# Patient Record
Sex: Female | Born: 1985 | Race: Black or African American | Hispanic: No | Marital: Single | Smoking: Never smoker
Health system: Southern US, Community
[De-identification: ages and names within clinical notes are randomized; demographics above are authoritative.]

## PROBLEM LIST (undated history)

## (undated) ENCOUNTER — Inpatient Hospital Stay (HOSPITAL_COMMUNITY): Payer: Self-pay

## (undated) DIAGNOSIS — A0472 Enterocolitis due to Clostridium difficile, not specified as recurrent: Secondary | ICD-10-CM

## (undated) DIAGNOSIS — R87629 Unspecified abnormal cytological findings in specimens from vagina: Secondary | ICD-10-CM

## (undated) DIAGNOSIS — A749 Chlamydial infection, unspecified: Secondary | ICD-10-CM

## (undated) DIAGNOSIS — D219 Benign neoplasm of connective and other soft tissue, unspecified: Secondary | ICD-10-CM

## (undated) DIAGNOSIS — Z87448 Personal history of other diseases of urinary system: Secondary | ICD-10-CM

## (undated) DIAGNOSIS — A498 Other bacterial infections of unspecified site: Secondary | ICD-10-CM

## (undated) DIAGNOSIS — N390 Urinary tract infection, site not specified: Secondary | ICD-10-CM

## (undated) HISTORY — PX: DILATION AND CURETTAGE OF UTERUS: SHX78

## (undated) HISTORY — DX: Personal history of other diseases of urinary system: Z87.448

## (undated) HISTORY — PX: CYSTECTOMY: SUR359

---

## 2008-06-04 ENCOUNTER — Emergency Department (HOSPITAL_COMMUNITY): Admission: EM | Admit: 2008-06-04 | Discharge: 2008-06-04 | Payer: Self-pay | Admitting: Family Medicine

## 2008-09-27 ENCOUNTER — Emergency Department (HOSPITAL_COMMUNITY): Admission: EM | Admit: 2008-09-27 | Discharge: 2008-09-27 | Payer: Self-pay | Admitting: Emergency Medicine

## 2008-09-29 ENCOUNTER — Emergency Department (HOSPITAL_COMMUNITY): Admission: EM | Admit: 2008-09-29 | Discharge: 2008-09-29 | Payer: Self-pay | Admitting: Family Medicine

## 2008-11-04 ENCOUNTER — Emergency Department (HOSPITAL_COMMUNITY): Admission: EM | Admit: 2008-11-04 | Discharge: 2008-11-04 | Payer: Self-pay | Admitting: Family Medicine

## 2008-11-04 ENCOUNTER — Inpatient Hospital Stay (HOSPITAL_COMMUNITY): Admission: AD | Admit: 2008-11-04 | Discharge: 2008-11-05 | Payer: Self-pay | Admitting: Obstetrics & Gynecology

## 2009-02-13 DIAGNOSIS — A0472 Enterocolitis due to Clostridium difficile, not specified as recurrent: Secondary | ICD-10-CM

## 2009-02-13 DIAGNOSIS — A498 Other bacterial infections of unspecified site: Secondary | ICD-10-CM

## 2009-02-13 HISTORY — DX: Enterocolitis due to Clostridium difficile, not specified as recurrent: A04.72

## 2009-02-13 HISTORY — DX: Other bacterial infections of unspecified site: A49.8

## 2009-04-06 ENCOUNTER — Emergency Department (HOSPITAL_COMMUNITY): Admission: EM | Admit: 2009-04-06 | Discharge: 2009-04-06 | Payer: Self-pay | Admitting: Emergency Medicine

## 2009-06-16 ENCOUNTER — Emergency Department (HOSPITAL_COMMUNITY): Admission: EM | Admit: 2009-06-16 | Discharge: 2009-06-16 | Payer: Self-pay | Admitting: Family Medicine

## 2009-06-24 ENCOUNTER — Emergency Department (HOSPITAL_COMMUNITY): Admission: EM | Admit: 2009-06-24 | Discharge: 2009-06-24 | Payer: Self-pay | Admitting: Emergency Medicine

## 2009-08-21 ENCOUNTER — Emergency Department (HOSPITAL_COMMUNITY): Admission: EM | Admit: 2009-08-21 | Discharge: 2009-08-21 | Payer: Self-pay | Admitting: Emergency Medicine

## 2009-08-23 ENCOUNTER — Observation Stay (HOSPITAL_COMMUNITY): Admission: EM | Admit: 2009-08-23 | Discharge: 2009-08-23 | Payer: Self-pay | Admitting: Emergency Medicine

## 2009-08-24 ENCOUNTER — Encounter (INDEPENDENT_AMBULATORY_CARE_PROVIDER_SITE_OTHER): Payer: Self-pay | Admitting: *Deleted

## 2009-09-18 ENCOUNTER — Inpatient Hospital Stay (HOSPITAL_COMMUNITY): Admission: AD | Admit: 2009-09-18 | Discharge: 2009-09-18 | Payer: Self-pay | Admitting: Obstetrics & Gynecology

## 2009-09-18 ENCOUNTER — Ambulatory Visit: Payer: Self-pay | Admitting: Nurse Practitioner

## 2009-10-01 ENCOUNTER — Ambulatory Visit: Payer: Self-pay | Admitting: Gastroenterology

## 2009-10-08 ENCOUNTER — Emergency Department (HOSPITAL_COMMUNITY): Admission: EM | Admit: 2009-10-08 | Discharge: 2009-10-08 | Payer: Self-pay | Admitting: Family Medicine

## 2010-01-09 ENCOUNTER — Emergency Department (HOSPITAL_COMMUNITY): Admission: EM | Admit: 2010-01-09 | Discharge: 2010-01-09 | Payer: Self-pay | Admitting: Emergency Medicine

## 2010-03-07 ENCOUNTER — Inpatient Hospital Stay (HOSPITAL_COMMUNITY)
Admission: AD | Admit: 2010-03-07 | Discharge: 2010-03-07 | Payer: Self-pay | Source: Home / Self Care | Attending: Obstetrics & Gynecology | Admitting: Obstetrics & Gynecology

## 2010-03-08 LAB — CBC
HCT: 36.9 % (ref 36.0–46.0)
Hemoglobin: 12 g/dL (ref 12.0–15.0)
MCH: 25.6 pg — ABNORMAL LOW (ref 26.0–34.0)
RBC: 4.68 MIL/uL (ref 3.87–5.11)
RDW: 13.6 % (ref 11.5–15.5)

## 2010-03-08 LAB — URINALYSIS, ROUTINE W REFLEX MICROSCOPIC
Bilirubin Urine: NEGATIVE
Nitrite: NEGATIVE
Protein, ur: NEGATIVE mg/dL
Specific Gravity, Urine: 1.02 (ref 1.005–1.030)
Urobilinogen, UA: 0.2 mg/dL (ref 0.0–1.0)

## 2010-03-08 LAB — ABO/RH: ABO/RH(D): A POS

## 2010-03-08 LAB — WET PREP, GENITAL: Trich, Wet Prep: NONE SEEN

## 2010-03-09 ENCOUNTER — Inpatient Hospital Stay (HOSPITAL_COMMUNITY)
Admission: AD | Admit: 2010-03-09 | Discharge: 2010-03-09 | Payer: Self-pay | Source: Home / Self Care | Attending: Obstetrics & Gynecology | Admitting: Obstetrics & Gynecology

## 2010-03-09 LAB — GC/CHLAMYDIA PROBE AMP, GENITAL: GC Probe Amp, Genital: NEGATIVE

## 2010-03-15 ENCOUNTER — Ambulatory Visit (HOSPITAL_COMMUNITY)
Admission: RE | Admit: 2010-03-15 | Discharge: 2010-03-15 | Payer: Self-pay | Source: Home / Self Care | Attending: Family Medicine | Admitting: Family Medicine

## 2010-03-15 DIAGNOSIS — O0289 Other abnormal products of conception: Secondary | ICD-10-CM

## 2010-03-15 NOTE — Letter (Signed)
Summary: New Patient letter  [facility]  [address] [place], [place] [address]   Phone: [phone]  Fax: [phone]       08/24/2009 MRN: [id number]  Kendra Guerrero [address] [place], [place]  [address]  Dear Kendra Guerrero,  Welcome to the Gastroenterology Division at [facility].    You are scheduled to see Dr.  Jarold Motto on 10-01-09 at [facility] on the 3rd floor at [facility], [address].  We ask that you try to arrive at our office 15 minutes prior to your appointment time to allow for check-in.  We would like you to complete the enclosed self-administered evaluation form prior to your visit and bring it with you on the day of your appointment.  We will review it with you.  Also, please bring a complete list of all your medications or, if you prefer, bring the medication bottles and we will list them.  Please bring your insurance card so that we may make a copy of it.  If your insurance requires a referral to see a specialist, please bring your referral form from your primary care physician.  Co-payments are due at the time of your visit and may be paid by cash, check or credit card.     Your office visit will consist of a consult with your physician (includes a physical exam), any laboratory testing he/she may order, scheduling of any necessary diagnostic testing (e.g. x-ray, ultrasound, CT-scan), and scheduling of a procedure (e.g. Endoscopy, Colonoscopy) if required.  Please allow enough time on your schedule to allow for any/all of these possibilities.    If you cannot keep your appointment, please call [phone] to cancel or reschedule prior to your appointment date.  This allows [place] the opportunity to schedule an appointment for another patient in need of care.  If you do not cancel or reschedule by 5 p.m. the business day prior to your appointment date, you will be charged a $50.00 late cancellation/no-show fee.    Thank you for  choosing [facility] for your medical needs.  We appreciate the opportunity to care for you.  Please visit [place] at our website  to learn more about our practice.                     Sincerely,                                                             The Gastroenterology Division

## 2010-03-23 ENCOUNTER — Emergency Department (HOSPITAL_COMMUNITY): Payer: Medicaid Other

## 2010-03-23 ENCOUNTER — Emergency Department (HOSPITAL_COMMUNITY)
Admission: EM | Admit: 2010-03-23 | Discharge: 2010-03-23 | Discharge status: Against Medical Advice | Payer: Medicaid Other | Attending: Emergency Medicine | Admitting: Emergency Medicine

## 2010-03-23 DIAGNOSIS — N898 Other specified noninflammatory disorders of vagina: Secondary | ICD-10-CM | POA: Insufficient documentation

## 2010-03-23 DIAGNOSIS — R109 Unspecified abdominal pain: Secondary | ICD-10-CM | POA: Insufficient documentation

## 2010-03-23 LAB — BASIC METABOLIC PANEL
BUN: 10 mg/dL (ref 6–23)
CO2: 27 mEq/L (ref 19–32)
Calcium: 9 mg/dL (ref 8.4–10.5)
Chloride: 105 mEq/L (ref 96–112)
Glucose, Bld: 112 mg/dL — ABNORMAL HIGH (ref 70–99)

## 2010-03-23 LAB — DIFFERENTIAL
Eosinophils Absolute: 0.2 10*3/uL (ref 0.0–0.7)
Lymphs Abs: 3.1 10*3/uL (ref 0.7–4.0)
Monocytes Absolute: 0.5 10*3/uL (ref 0.1–1.0)
Monocytes Relative: 7 % (ref 3–12)
Neutro Abs: 3.3 10*3/uL (ref 1.7–7.7)

## 2010-03-23 LAB — CBC
MCH: 25.3 pg — ABNORMAL LOW (ref 26.0–34.0)
MCHC: 32.8 g/dL (ref 30.0–36.0)
MCV: 77.3 fL — ABNORMAL LOW (ref 78.0–100.0)
Platelets: 301 10*3/uL (ref 150–400)
WBC: 7.2 10*3/uL (ref 4.0–10.5)

## 2010-04-26 LAB — URINALYSIS, ROUTINE W REFLEX MICROSCOPIC
Bilirubin Urine: NEGATIVE
Glucose, UA: NEGATIVE mg/dL
Ketones, ur: NEGATIVE mg/dL
Nitrite: NEGATIVE
Protein, ur: 30 mg/dL — AB
Specific Gravity, Urine: 1.025 (ref 1.005–1.030)
Urobilinogen, UA: 0.2 mg/dL (ref 0.0–1.0)
pH: 7 (ref 5.0–8.0)

## 2010-04-26 LAB — URINE MICROSCOPIC-ADD ON

## 2010-04-26 LAB — POCT PREGNANCY, URINE: Preg Test, Ur: NEGATIVE

## 2010-04-29 LAB — HCG, QUANTITATIVE, PREGNANCY: hCG, Beta Chain, Quant, S: 2379 m[IU]/mL — ABNORMAL HIGH (ref <5)

## 2010-04-29 LAB — CBC
MCH: 26.5 pg (ref 26.0–34.0)
MCHC: 33.3 g/dL (ref 30.0–36.0)
MCV: 79.6 fL (ref 78.0–100.0)
Platelets: 250 10*3/uL (ref 150–400)
RDW: 14.6 % (ref 11.5–15.5)

## 2010-04-29 LAB — URINALYSIS, ROUTINE W REFLEX MICROSCOPIC
Protein, ur: NEGATIVE mg/dL
Specific Gravity, Urine: 1.025 (ref 1.005–1.030)

## 2010-04-29 LAB — WET PREP, GENITAL: Clue Cells Wet Prep HPF POC: NONE SEEN

## 2010-05-01 LAB — CBC
HCT: 35.3 % — ABNORMAL LOW (ref 36.0–46.0)
Hemoglobin: 11.8 g/dL — ABNORMAL LOW (ref 12.0–15.0)
MCH: 26.7 pg (ref 26.0–34.0)
MCV: 80 fL (ref 78.0–100.0)
MCV: 80.2 fL (ref 78.0–100.0)
Platelets: 220 10*3/uL (ref 150–400)
Platelets: 270 10*3/uL (ref 150–400)
RBC: 4.41 MIL/uL (ref 3.87–5.11)
RBC: 4.67 MIL/uL (ref 3.87–5.11)
RDW: 14 % (ref 11.5–15.5)
WBC: 15 10*3/uL — ABNORMAL HIGH (ref 4.0–10.5)
WBC: 9.9 10*3/uL (ref 4.0–10.5)

## 2010-05-01 LAB — URINALYSIS, ROUTINE W REFLEX MICROSCOPIC
Glucose, UA: NEGATIVE mg/dL
Nitrite: NEGATIVE
Specific Gravity, Urine: 1.018 (ref 1.005–1.030)
pH: 6 (ref 5.0–8.0)

## 2010-05-01 LAB — COMPREHENSIVE METABOLIC PANEL
Albumin: 3.1 g/dL — ABNORMAL LOW (ref 3.5–5.2)
Alkaline Phosphatase: 60 U/L (ref 39–117)
BUN: 5 mg/dL — ABNORMAL LOW (ref 6–23)
CO2: 26 mEq/L (ref 19–32)
Chloride: 106 mEq/L (ref 96–112)
GFR calc non Af Amer: 59 mL/min — ABNORMAL LOW (ref >60)
Potassium: 3.2 mEq/L — ABNORMAL LOW (ref 3.5–5.1)
Total Bilirubin: 0.6 mg/dL (ref 0.3–1.2)

## 2010-05-01 LAB — URINE MICROSCOPIC-ADD ON

## 2010-05-01 LAB — GIARDIA/CRYPTOSPORIDIUM SCREEN(EIA): Cryptosporidium Screen (EIA): NEGATIVE

## 2010-05-01 LAB — DIFFERENTIAL
Basophils Absolute: 0 10*3/uL (ref 0.0–0.1)
Basophils Absolute: 0 10*3/uL (ref 0.0–0.1)
Basophils Relative: 0 % (ref 0–1)
Eosinophils Relative: 0 % (ref 0–5)
Eosinophils Relative: 1 % (ref 0–5)
Lymphocytes Relative: 5 % — ABNORMAL LOW (ref 12–46)
Lymphs Abs: 0.8 10*3/uL (ref 0.7–4.0)
Monocytes Absolute: 1 10*3/uL (ref 0.1–1.0)
Neutro Abs: 13.8 10*3/uL — ABNORMAL HIGH (ref 1.7–7.7)
Neutro Abs: 7.6 10*3/uL (ref 1.7–7.7)

## 2010-05-01 LAB — CLOSTRIDIUM DIFFICILE EIA: C difficile Toxins A+B, EIA: NEGATIVE

## 2010-05-01 LAB — PREGNANCY, URINE: Preg Test, Ur: NEGATIVE

## 2010-05-01 LAB — BASIC METABOLIC PANEL
BUN: 9 mg/dL (ref 6–23)
Chloride: 105 mEq/L (ref 96–112)
Creatinine, Ser: 1.09 mg/dL (ref 0.4–1.2)
GFR calc Af Amer: >60 mL/min (ref >60)
GFR calc non Af Amer: >60 mL/min (ref >60)
Potassium: 3.1 mEq/L — ABNORMAL LOW (ref 3.5–5.1)

## 2010-05-01 LAB — LIPASE, BLOOD: Lipase: 17 U/L (ref 11–59)

## 2010-05-03 LAB — STOOL CULTURE

## 2010-05-03 LAB — POCT I-STAT, CHEM 8
Calcium, Ion: 1.16 mmol/L (ref 1.12–1.32)
Glucose, Bld: 98 mg/dL (ref 70–99)
HCT: 42 % (ref 36.0–46.0)
Hemoglobin: 14.3 g/dL (ref 12.0–15.0)
TCO2: 28 mmol/L (ref 0–100)

## 2010-05-03 LAB — URINALYSIS, ROUTINE W REFLEX MICROSCOPIC
Ketones, ur: 40 mg/dL — AB
Nitrite: NEGATIVE
Protein, ur: NEGATIVE mg/dL
Urobilinogen, UA: 0.2 mg/dL (ref 0.0–1.0)

## 2010-05-03 LAB — POCT URINALYSIS DIP (DEVICE)
Bilirubin Urine: NEGATIVE
Nitrite: NEGATIVE
Protein, ur: NEGATIVE mg/dL
pH: 5.5 (ref 5.0–8.0)

## 2010-05-03 LAB — CLOSTRIDIUM DIFFICILE EIA

## 2010-05-04 LAB — WET PREP, GENITAL: Clue Cells Wet Prep HPF POC: NONE SEEN

## 2010-05-04 LAB — GC/CHLAMYDIA PROBE AMP, GENITAL: Chlamydia, DNA Probe: NEGATIVE

## 2010-05-10 ENCOUNTER — Inpatient Hospital Stay (INDEPENDENT_AMBULATORY_CARE_PROVIDER_SITE_OTHER)
Admission: RE | Admit: 2010-05-10 | Discharge: 2010-05-10 | Disposition: A | Discharge status: Home / Self Care | Payer: Self-pay | Source: Ambulatory Visit | Attending: Family Medicine | Admitting: Family Medicine

## 2010-05-10 DIAGNOSIS — R300 Dysuria: Secondary | ICD-10-CM

## 2010-05-10 LAB — POCT PREGNANCY, URINE: Preg Test, Ur: NEGATIVE

## 2010-05-11 LAB — POCT URINALYSIS DIP (DEVICE)
Nitrite: NEGATIVE
Protein, ur: 100 mg/dL — AB
Urobilinogen, UA: 0.2 mg/dL (ref 0.0–1.0)

## 2010-05-20 LAB — WET PREP, GENITAL
Trich, Wet Prep: NONE SEEN
Yeast Wet Prep HPF POC: NONE SEEN

## 2010-05-20 LAB — POCT URINALYSIS DIP (DEVICE)
Glucose, UA: NEGATIVE mg/dL
Nitrite: NEGATIVE
Urobilinogen, UA: 2 mg/dL — ABNORMAL HIGH (ref 0.0–1.0)

## 2010-05-20 LAB — POCT PREGNANCY, URINE: Preg Test, Ur: POSITIVE

## 2010-05-20 LAB — CBC
MCV: 78.6 fL (ref 78.0–100.0)
Platelets: 278 10*3/uL (ref 150–400)
WBC: 7.6 10*3/uL (ref 4.0–10.5)

## 2010-05-20 LAB — GC/CHLAMYDIA PROBE AMP, GENITAL: GC Probe Amp, Genital: NEGATIVE

## 2010-05-21 LAB — POCT RAPID STREP A (OFFICE): Streptococcus, Group A Screen (Direct): NEGATIVE

## 2010-05-25 ENCOUNTER — Inpatient Hospital Stay (INDEPENDENT_AMBULATORY_CARE_PROVIDER_SITE_OTHER)
Admission: RE | Admit: 2010-05-25 | Discharge: 2010-05-25 | Disposition: A | Discharge status: Home / Self Care | Payer: Self-pay | Source: Ambulatory Visit

## 2010-05-25 DIAGNOSIS — N760 Acute vaginitis: Secondary | ICD-10-CM

## 2010-05-25 DIAGNOSIS — A499 Bacterial infection, unspecified: Secondary | ICD-10-CM

## 2010-05-25 LAB — POCT URINALYSIS DIP (DEVICE)
Bilirubin Urine: NEGATIVE
Ketones, ur: NEGATIVE mg/dL
pH: 6 (ref 5.0–8.0)

## 2011-04-02 ENCOUNTER — Inpatient Hospital Stay (HOSPITAL_COMMUNITY): Payer: Self-pay

## 2011-04-02 ENCOUNTER — Encounter (HOSPITAL_COMMUNITY): Payer: Self-pay | Admitting: *Deleted

## 2011-04-02 ENCOUNTER — Inpatient Hospital Stay (HOSPITAL_COMMUNITY)
Admission: AD | Admit: 2011-04-02 | Discharge: 2011-04-02 | Disposition: A | Discharge status: Home / Self Care | Payer: Self-pay | Source: Ambulatory Visit | Attending: Obstetrics & Gynecology | Admitting: Obstetrics & Gynecology

## 2011-04-02 DIAGNOSIS — O046 Delayed or excessive hemorrhage following (induced) termination of pregnancy: Secondary | ICD-10-CM | POA: Insufficient documentation

## 2011-04-02 HISTORY — DX: Chlamydial infection, unspecified: A74.9

## 2011-04-02 HISTORY — DX: Enterocolitis due to Clostridium difficile, not specified as recurrent: A04.72

## 2011-04-02 LAB — CBC
Hemoglobin: 12.3 g/dL (ref 12.0–15.0)
MCH: 25.9 pg — ABNORMAL LOW (ref 26.0–34.0)
RBC: 4.74 MIL/uL (ref 3.87–5.11)

## 2011-04-02 LAB — HCG, QUANTITATIVE, PREGNANCY: hCG, Beta Chain, Quant, S: 40152 m[IU]/mL — ABNORMAL HIGH (ref <5)

## 2011-04-02 MED ORDER — LACTATED RINGERS IV SOLN: INTRAVENOUS | Status: DC

## 2011-04-02 MED ORDER — ONDANSETRON HCL 4 MG/2ML IJ SOLN
4.0000 mg | Freq: Once | INTRAMUSCULAR | Status: AC
  Administered 2011-04-02: 4 mg via INTRAVENOUS
  Filled 2011-04-02: qty 2

## 2011-04-02 MED ORDER — HYDROMORPHONE HCL PF 1 MG/ML IJ SOLN
1.0000 mg | Freq: Once | INTRAMUSCULAR | Status: AC
  Administered 2011-04-02: 1 mg via INTRAVENOUS
  Filled 2011-04-02: qty 1

## 2011-04-02 MED ORDER — LACTATED RINGERS IV BOLUS (SEPSIS)
1000.0000 mL | Freq: Once | INTRAVENOUS | Status: AC
  Administered 2011-04-02: 1000 mL via INTRAVENOUS

## 2011-04-02 NOTE — Progress Notes (Signed)
V.Smith,CNM at bedside Evaluating bleeding.

## 2011-04-02 NOTE — Progress Notes (Signed)
Small to moderate amount of vaginal blood noted. POC removed from vaginal vault. Pt tolerated well.

## 2011-04-02 NOTE — Progress Notes (Signed)
Pt went to [facility] planning clinic today and got "the abortion pill". She took the medication at 1200 and started cramping and passing large clots around 1430.

## 2011-04-02 NOTE — ED Provider Notes (Signed)
History   Kendra Guerrero is a 26 y.o. year old G72P1011 female at [redacted]w[redacted]d weeks gestation [facility] [place] [facility] who arrived in MAU by EMS reporting severe abdominal pain and heavy vaginal bleeding that started at 1230 after taking 4 tablets of Cyt[facility] by [facility] at 1200. She denies passage of tissue. Pt states she was told that she had a single IUP.  CSN: [id number]  Arrival date & time 04/02/11  1650   None    Chief Complaint  Patient presents with  . Abdominal Pain    (Consider location/radiation/quality/duration/timing/severity/associated sxs/prior treatment) HPI  Past Medical History  Diagnosis Date  . Chlamydia   . C. difficile diarrhea 2011    Past Surgical History  Procedure Date  . No past surgeries     No family history on file.  History  Substance Use Topics  . Smoking status: Never Smoker   . Smokeless tobacco: Not on file  . Alcohol Use: No    OB History    Grav Para Term Preterm Abortions TAB SAB Ect Mult Living   3 1 1  1  1   1      Review of Systems  Constitutional: Positive for chills. Negative for fever.  Gastrointestinal: Positive for nausea and abdominal pain. Negative for vomiting, diarrhea and constipation.  Genitourinary: Positive for vaginal bleeding. Negative for dysuria and flank pain.  Neurological: Negative for syncope and light-headedness.   Allergies  Review of patient's allergies indicates no known allergies.  Home Medications  No current outpatient prescriptions on file.  BP 115/43  Pulse 95  Temp(Src) 98.7 F (37.1 C) (Oral)  Resp 18  LMP 02/14/2011  Physical Exam  Nursing note and vitals reviewed. Constitutional: She is oriented to person, place, and time. She appears well-developed and well-nourished. She appears distressed (moderate).  Cardiovascular: Normal rate.   Pulmonary/Chest: Effort normal.  Abdominal: Soft. She exhibits no distension. There is tenderness. There is no guarding.   Genitourinary: There is no lesion on the right labia. There is no lesion on the left labia. Uterus is tender. Right adnexum displays no mass, no tenderness and no fullness. Left adnexum displays no mass, no tenderness and no fullness. There is bleeding (moderate BRB w/ small clots. 2x4 cm tissue in vault. ) around the vagina.       Pt very uncomfortable on exam. Unable to localize pain. Moaning before and during exam.  Neurological: She is alert and oriented to person, place, and time.  Skin: Skin is warm and dry. No pallor.    ED Course  Procedures (including critical care time)  Results for orders placed during the hospital encounter of 04/02/11 (from the past 24 hour(s))  CBC     Status: Abnormal   Collection Time   04/02/11  5:10 PM      Component Value Range   WBC 11.0 (*) 4.0 - 10.5 (K/uL)   RBC 4.74  3.87 - 5.11 (MIL/uL)   Hemoglobin 12.3  12.0 - 15.0 (g/dL)   HCT 40.9  81.1 - 91.4 (%)   MCV 80.0  78.0 - 100.0 (fL)   MCH 25.9 (*) 26.0 - 34.0 (pg)   MCHC 32.5  30.0 - 36.0 (g/dL)   RDW 78.2  95.6 - 21.3 (%)   Platelets 294  150 - 400 (K/uL)  HCG, QUANTITATIVE, PREGNANCY     Status: Abnormal   Collection Time   04/02/11  5:11 PM  Component Value Range   hCG, Beta Chain, Quant, [place] [address] (*) <5 (mIU/mL)     US Ob Comp Less 14 Wks  04/02/2011  *RADIOLOGY REPORT*  Clinical Data: Heavy bleeding.  Pain after taking abortion medicine.  OBSTETRIC <14 WK [place] AND TRANSVAGINAL OB US  Technique:  Both transabdominal and transvaginal ultrasound examinations were performed for complete evaluation of the gestation as well as the maternal uterus, adnexal regions, and pelvic cul-de-sac.  Transvaginal technique was performed to assess early pregnancy.  Comparison:  None.  Intrauterine gestational sac:  No intrauterine gestational sac. Yolk sac: None visualized Embryo: None visualized Cardiac Activity: Not applicable Heart Rate: Not applicable bpm  Maternal uterus/adnexae: Endometrial thickness is  15 mm.  Retained products of conception is not excluded although there is no internal vascular flow in the thickened endometrium.  The left ovary demonstrates a corpus luteum cyst.  Both ovaries have a physiologic appearance.  Trace free fluid is present in the anatomic pelvis, likely physiologic.  IMPRESSION:  1.  No viable intrauterine pregnancy identified, presumably missed abortion or induced abortion. 2.  Endometrial thickening measuring 15 mm.  No internal vascular flow.  Retained products of conception are not excluded and follow up should be considered.  Original Report Authenticated By: Andreas Newport, M.D.   US Ob Transvaginal  04/02/2011  *RADIOLOGY REPORT*  Clinical Data: Heavy bleeding.  Pain after taking abortion medicine.  OBSTETRIC <14 WK [place] AND TRANSVAGINAL OB US  Technique:  Both transabdominal and transvaginal ultrasound examinations were performed for complete evaluation of the gestation as well as the maternal uterus, adnexal regions, and pelvic cul-de-sac.  Transvaginal technique was performed to assess early pregnancy.  Comparison:  None.  Intrauterine gestational sac:  No intrauterine gestational sac. Yolk sac: None visualized Embryo: None visualized Cardiac Activity: Not applicable Heart Rate: Not applicable bpm  Maternal uterus/adnexae: Endometrial thickness is 15 mm.  Retained products of conception is not excluded although there is no internal vascular flow in the thickened endometrium.  The left ovary demonstrates a corpus luteum cyst.  Both ovaries have a physiologic appearance.  Trace free fluid is present in the anatomic pelvis, likely physiologic.  IMPRESSION:  1.  No viable intrauterine pregnancy identified, presumably missed abortion or induced abortion. 2.  Endometrial thickening measuring 15 mm.  No internal vascular flow.  Retained products of conception are not excluded and follow up should be considered.  Original Report Authenticated By: Andreas Newport, M.D.   Pain 2/10  after Dilaudid. Nausea resolved w/ Zofran. NAD. Passed 6x8 cm irreg fragment of tan tissue, large amount for [redacted] week gestation. Bleeding small amount since pelvic exam.   1. Legal abortion with hemorrhage-complete, hemodynamically stable   MDM  D/C home per consult w/ Dr. Marice Potter Bleeding precautions F/U as scheduled at [facility] in 1 week or MAU PRN for worsening Sx. Iron supplement Start OCPs as directed Ibuprofen PRN for pain POC to path  Dorathy Kinsman 04/02/2011 5:46 PM

## 2011-05-19 ENCOUNTER — Encounter (HOSPITAL_COMMUNITY): Payer: Self-pay | Admitting: Emergency Medicine

## 2011-05-19 ENCOUNTER — Emergency Department (HOSPITAL_COMMUNITY)
Admission: EM | Admit: 2011-05-19 | Discharge: 2011-05-20 | Disposition: A | Discharge status: Home / Self Care | Payer: 59 | Attending: Emergency Medicine | Admitting: Emergency Medicine

## 2011-05-19 DIAGNOSIS — N390 Urinary tract infection, site not specified: Secondary | ICD-10-CM

## 2011-05-19 DIAGNOSIS — R300 Dysuria: Secondary | ICD-10-CM | POA: Insufficient documentation

## 2011-05-19 LAB — URINALYSIS, ROUTINE W REFLEX MICROSCOPIC
Ketones, ur: NEGATIVE mg/dL
Nitrite: NEGATIVE
Protein, ur: 30 mg/dL — AB

## 2011-05-19 LAB — URINE MICROSCOPIC-ADD ON

## 2011-05-19 NOTE — ED Notes (Signed)
C/o difficulty urinating and pain/burning with urination x 1 week.  Reports L flank pain.

## 2011-05-20 MED ORDER — CIPROFLOXACIN HCL 500 MG PO TABS
500.0000 mg | ORAL_TABLET | Freq: Once | ORAL | Status: AC
  Administered 2011-05-20: 500 mg via ORAL
  Filled 2011-05-20: qty 1

## 2011-05-20 MED ORDER — PHENAZOPYRIDINE HCL 200 MG PO TABS: 200.0000 mg | ORAL_TABLET | Freq: Three times a day (TID) | ORAL | Status: AC

## 2011-05-20 MED ORDER — CIPROFLOXACIN HCL 500 MG PO TABS: 500.0000 mg | ORAL_TABLET | Freq: Two times a day (BID) | ORAL | Status: AC

## 2011-05-20 MED ORDER — PHENAZOPYRIDINE HCL 100 MG PO TABS
200.0000 mg | ORAL_TABLET | Freq: Once | ORAL | Status: AC
  Administered 2011-05-20: 200 mg via ORAL
  Filled 2011-05-20: qty 2

## 2011-05-20 NOTE — ED Provider Notes (Signed)
History     CSN: [id number]  Arrival date & time 05/19/11  2109   First MD Initiated Contact with Patient 05/20/11 0110      Chief Complaint  Patient presents with  . Dysuria    (Consider location/radiation/quality/duration/timing/severity/associated sxs/prior treatment) HPI Is a 26 year old black female with a 6 day history of dysuria in which she means burning with urination. The symptoms are mild until yesterday when they became moderate to severe. She is having mild bilateral flank pain and moderate suprapubic pain. There is no associated fever, chills, nausea, vomiting, diarrhea, vaginal bleeding or discharge. She's had urinary tract infections in the past and states this feels similar.  Past Medical History  Diagnosis Date  . Chlamydia   . C. difficile diarrhea 2011    Past Surgical History  Procedure Date  . No past surgeries     No family history on file.  History  Substance Use Topics  . Smoking status: Never Smoker   . Smokeless tobacco: Not on file  . Alcohol Use: No    OB History    Grav Para Term Preterm Abortions TAB SAB Ect Mult Living   3 1 1  1  1   1       Review of Systems  All other systems reviewed and are negative.    Allergies  Review of patient's allergies indicates no known allergies.  Home Medications   Current Outpatient Rx  Name Route Sig Dispense Refill  . IBUPROFEN 200 MG PO TABS Oral Take 200 mg by mouth every 6 (six) hours as needed. For pain      BP 118/75  Pulse 83  Temp(Src) 98 F (36.7 C) (Oral)  Resp 18  SpO2 98%  LMP 05/19/2011  Breastfeeding? Unknown  Physical Exam General: Well-developed, well-nourished female in no acute distress; appearance consistent with age of record HENT: normocephalic, atraumatic Eyes: pupils equal round and reactive to light; extraocular muscles intact Neck: supple Heart: regular rate and rhythm Lungs: clear to auscultation bilaterally Abdomen: soft; nondistended; suprapubic  tenderness; bowel sounds present GU: Mild bilateral flank tenderness Extremities: No deformity; full range of motion Neurologic: Awake, alert and oriented; motor function intact in all extremities and symmetric; no facial droop Skin: Warm and dry Psychiatric: Normal mood and affect    ED Course  Procedures (including critical care time)     MDM   Nursing notes and vitals signs, including pulse oximetry, reviewed.  Summary of this visit's results, reviewed by myself:  Labs:  Results for orders placed during the hospital encounter of 05/19/11  URINALYSIS, ROUTINE W REFLEX MICROSCOPIC      Component Value Range   Color, Urine YELLOW  YELLOW    APPearance CLOUDY (*) CLEAR    Specific Gravity, Urine 1.029  1.005 - 1.030    pH 6.0  5.0 - 8.0    Glucose, UA NEGATIVE  NEGATIVE (mg/dL)   Hgb urine dipstick LARGE (*) NEGATIVE    Bilirubin Urine NEGATIVE  NEGATIVE    Ketones, ur NEGATIVE  NEGATIVE (mg/dL)   Protein, ur 30 (*) NEGATIVE (mg/dL)   Urobilinogen, UA 1.0  0.0 - 1.0 (mg/dL)   Nitrite NEGATIVE  NEGATIVE    Leukocytes, UA LARGE (*) NEGATIVE   POCT PREGNANCY, URINE      Component Value Range   Preg Test, Ur NEGATIVE  NEGATIVE   URINE MICROSCOPIC-ADD ON      Component Value Range   Squamous Epithelial / LPF RARE  RARE  WBC, UA TOO NUMEROUS TO COUNT  <3 (WBC/hpf)   RBC / HPF 21-50  <3 (RBC/hpf)   Bacteria, UA MANY (*) RARE            Hanley Seamen, MD 05/20/11 [phone]

## 2011-05-20 NOTE — Discharge Instructions (Signed)

## 2011-05-20 NOTE — ED Notes (Signed)
The pt has had painful urination and flank pai for approx one week.  No bloody urine.  She does not feel,ike she empties her bladder when she urinates

## 2011-06-17 ENCOUNTER — Inpatient Hospital Stay (HOSPITAL_COMMUNITY)
Admission: AD | Admit: 2011-06-17 | Discharge: 2011-06-17 | Disposition: A | Discharge status: Home / Self Care | Payer: 59 | Source: Ambulatory Visit | Attending: Obstetrics and Gynecology | Admitting: Obstetrics and Gynecology

## 2011-06-17 ENCOUNTER — Encounter (HOSPITAL_COMMUNITY): Payer: Self-pay | Admitting: *Deleted

## 2011-06-17 DIAGNOSIS — J029 Acute pharyngitis, unspecified: Secondary | ICD-10-CM | POA: Insufficient documentation

## 2011-06-17 DIAGNOSIS — N949 Unspecified condition associated with female genital organs and menstrual cycle: Secondary | ICD-10-CM | POA: Insufficient documentation

## 2011-06-17 HISTORY — DX: Other bacterial infections of unspecified site: A49.8

## 2011-06-17 NOTE — MAU Note (Signed)
D.Paul CNM in to see pt and checked perineum.

## 2011-06-17 NOTE — MAU Note (Signed)
Shaved below last Friday. Sunday noticed a bump down there. Doesn't itch or burn. No nausea. On Friday  seen at [facility] because had red bump on labia. MD told her didn't look like herpes and had ingrown hair. Now throat alittle sore and has white spot on right side of throat. Also having vaginal irritation so came in tonight due to throat and vaginal pain.

## 2011-06-17 NOTE — MAU Provider Note (Signed)
History     CSN: [id number]  Arrival date and time: 06/17/11 2009   First Provider Initiated Contact with Patient 06/17/11 2108      Chief Complaint  Patient presents with  . Vaginal Pain  . Sore Throat   HPI Comments: Patient seen in office yesterday, was given Mupirocin ointment for possible folliculitis abrasion , HSV culture pending.  Vaginal Pain The patient's primary symptoms include genital lesions. This is a new problem. The current episode started in the past 7 days. The problem has been gradually improving. The patient is experiencing no pain. The problem affects both sides. She is not pregnant. Associated symptoms include a sore throat. Pertinent negatives include no fever, frequency, headaches, painful intercourse or urgency.  Sore Throat  This is a new problem. The current episode started yesterday. There has been no fever. The pain is mild. Associated symptoms include swollen glands. Pertinent negatives include no congestion, coughing, headaches or neck pain. She has had no exposure to strep or mono. She has tried nothing for the symptoms.    Pertinent Gynecological History: Menses: flow is light Bleeding: currently on menses Contraception: OCP (estrogen/progesterone) DES exposure: unknown Blood transfusions: none Sexually transmitted diseases: past history: chlamydia  Previous GYN Procedures: DNC  Last mammogram: n/a Last pap: normal Date: 01/2011   Past Medical History  Diagnosis Date  . Chlamydia   . C. difficile diarrhea 2011  . Clostridium difficile infection 2011    Past Surgical History  Procedure Date  . Dilation and curettage of uterus     Family History  Problem Relation Age of Onset  . Anesthesia problems Neg Hx   . Hypotension Neg Hx   . Malignant hyperthermia Neg Hx   . Pseudochol deficiency Neg Hx     History  Substance Use Topics  . Smoking status: Never Smoker   . Smokeless tobacco: Not on file  . Alcohol Use: Yes     occasional     Allergies: No Known Allergies  Prescriptions prior to admission  Medication Sig Dispense Refill  . mupirocin ointment (BACTROBAN) 2 % Apply 1 application topically 3 (three) times daily.      . Norethindrone-Ethinyl Estradiol Triphasic (TRI-NORINYL, 28,) 0.5/1/0.5-35 MG-MCG tablet Take 1 tablet by mouth daily.        Review of Systems  Constitutional: Negative.  Negative for fever.  HENT: Positive for sore throat. Negative for congestion and neck pain.   Respiratory: Negative for cough.   Genitourinary: Positive for vaginal pain. Negative for urgency and frequency.  Neurological: Negative for headaches.   Physical Exam   Blood pressure 126/74, pulse 81, temperature 98.3 F (36.8 C), temperature source Oral, resp. rate 20, height 5\' 4"  (1.626 m), weight 102.694 kg (226 lb 6.4 oz), last menstrual period 06/10/2011, SpO2 100.00%.  Physical Exam  Constitutional: She is oriented to person, place, and time. She appears well-developed and well-nourished. No distress.  HENT:  Head: Normocephalic.  Mouth/Throat: Mucous membranes are normal. Posterior oropharyngeal edema (mild) present. No oropharyngeal exudate.  Eyes: Pupils are equal, round, and reactive to light.  Neck: Normal range of motion. Neck supple.  Cardiovascular: Normal rate.   Respiratory: Effort normal and breath sounds normal.  GI: Soft. Bowel sounds are normal.  Genitourinary: Vagina normal.    Lesion: small pinpoint scattered lesions noted bilaterally inner labia minora, X6-7, and healing lesion on base of L labia majora basej.  Musculoskeletal: Normal range of motion. She exhibits no edema.  Neurological: She is alert and  oriented to person, place, and time.  Skin: Skin is warm and dry.  Psychiatric: She has a normal mood and affect.    MAU Course  Procedures  Rapid strep culture pending  Assessment and Plan  Vaginal lesion - healing HSV cx pending in office Continue current treatment w/ Mupirocin  cream  Sore throat Suspect viral infection, comfort measures Rapid strep test pending, will call patient with results  D/C home, will notify patient of all lab results pending.    Sevan Mcbroom 06/17/2011, 9:17 PM

## 2011-06-17 NOTE — Progress Notes (Signed)
Written and verbal d/c instructions given by D. Renae Fickle CNM and understanding voiced

## 2011-06-17 NOTE — Progress Notes (Signed)
Colon Flattery CNM notified of pt's admission and status. Rapid throat culture ordered and will see pt

## 2011-06-19 ENCOUNTER — Encounter (HOSPITAL_COMMUNITY): Payer: Self-pay

## 2011-06-19 ENCOUNTER — Emergency Department (INDEPENDENT_AMBULATORY_CARE_PROVIDER_SITE_OTHER)
Admission: EM | Admit: 2011-06-19 | Discharge: 2011-06-19 | Disposition: A | Discharge status: Home / Self Care | Payer: 59 | Source: Home / Self Care | Attending: Emergency Medicine | Admitting: Emergency Medicine

## 2011-06-19 DIAGNOSIS — J029 Acute pharyngitis, unspecified: Secondary | ICD-10-CM

## 2011-06-19 LAB — POCT RAPID STREP A: Streptococcus, Group A Screen (Direct): NEGATIVE

## 2011-06-19 MED ORDER — IBUPROFEN 600 MG PO TABS: 600.0000 mg | ORAL_TABLET | Freq: Four times a day (QID) | ORAL | Status: AC | PRN

## 2011-06-19 MED ORDER — LIDOCAINE VISCOUS 2 % MT SOLN: 10.0000 mL | Freq: Three times a day (TID) | OROMUCOSAL | Status: AC | PRN

## 2011-06-19 MED ORDER — HYDROCODONE-ACETAMINOPHEN 5-325 MG PO TABS: 2.0000 | ORAL_TABLET | ORAL | Status: AC | PRN

## 2011-06-19 NOTE — ED Notes (Signed)
C/o sore throat for 2-3 days.  Denies fever.

## 2011-06-19 NOTE — ED Provider Notes (Signed)
History     CSN: [id number]  Arrival date & time 06/19/11  1903   First MD Initiated Contact with Patient 06/19/11 1932      Chief Complaint  Patient presents with  . Sore Throat    (Consider location/radiation/quality/duration/timing/severity/associated sxs/prior treatment) HPI Comments: SORE THROAT  Onset: 2-3 days    Severity: moderate Tried OTC meds without significant relief.   Symptoms:  No Fever + Swollen neck glands    + Cough/URI sxs No allergy type symptoms No Myalgias No Headache No Rash     No Recent Strep Exposure, but daughter has a similar sore throat. No Abdominal Pain No reflux sxs No Allergy sxs  No Breathing difficulty No Drooling No Trismus  ROS as noted in HPI. All other ROS negative.    Patient is a 26 y.o. female presenting with pharyngitis. The history is provided by the patient. No language interpreter was used.  Sore Throat This is a new problem. The current episode started more than 2 days ago. The problem occurs constantly. The problem has not changed since onset.Pertinent negatives include no chest pain, no abdominal pain, no headaches and no shortness of breath. The symptoms are aggravated by swallowing. The symptoms are relieved by nothing.    Past Medical History  Diagnosis Date  . Chlamydia   . C. difficile diarrhea 2011  . Clostridium difficile infection 2011    Past Surgical History  Procedure Date  . Dilation and curettage of uterus     Family History  Problem Relation Age of Onset  . Anesthesia problems Neg Hx   . Hypotension Neg Hx   . Malignant hyperthermia Neg Hx   . Pseudochol deficiency Neg Hx     History  Substance Use Topics  . Smoking status: Never Smoker   . Smokeless tobacco: Not on file  . Alcohol Use: Yes     occasional    OB History    Grav Para Term Preterm Abortions TAB SAB Ect Mult Living   4 1 1  3 2 1   1       Review of Systems  Respiratory: Negative for shortness of breath.     Cardiovascular: Negative for chest pain.  Gastrointestinal: Negative for abdominal pain.  Neurological: Negative for headaches.    Allergies  Review of patient's allergies indicates no known allergies.  Home Medications   Current Outpatient Rx  Name Route Sig Dispense Refill  . MUPIROCIN 2 % EX OINT Topical Apply 1 application topically 3 (three) times daily.    Kendra Guerrero ESTRAD TRIPHASIC 0.5/1/0.5-35 MG-MCG PO TABS Oral Take 1 tablet by mouth daily.    Marland Kitchen HYDROCODONE-ACETAMINOPHEN 5-325 MG PO TABS Oral Take 2 tablets by mouth every 4 (four) hours as needed for pain. 20 tablet 0  . IBUPROFEN 600 MG PO TABS Oral Take 1 tablet (600 mg total) by mouth every 6 (six) hours as needed for pain. 30 tablet 0  . LIDOCAINE VISCOUS 2 % MT SOLN Oral Take 10 mLs by mouth 3 (three) times daily as needed for pain. Swish and spit. Do not swallow. 100 mL 0    BP 124/85  Pulse 68  Temp(Src) 98.9 F (37.2 C) (Oral)  Resp 18  SpO2 100%  LMP 06/18/2011  Physical Exam  Nursing note and vitals reviewed. Constitutional: She is oriented to person, place, and time. She appears well-developed and well-nourished.  HENT:  Head: Normocephalic and atraumatic. No trismus in the jaw.  Right Ear: Tympanic membrane and  ear canal normal.  Left Ear: Tympanic membrane and ear canal normal.  Nose: Mucosal edema and rhinorrhea present.  Mouth/Throat: Uvula is midline and mucous membranes are normal. No oral lesions. Normal dentition. No tonsillar abscesses.       Erythematous, enlarged tonsils with scant exudates. (-) frontal, maxillary sinus tenderness  Eyes: Conjunctivae and EOM are normal. Pupils are equal, round, and reactive to light.  Neck: Normal range of motion.       Shotty cervical lymphadenopathy bilaterally  Cardiovascular: Normal rate, regular rhythm and normal heart sounds.   Pulmonary/Chest: Effort normal and breath sounds normal.  Abdominal: Soft. Bowel sounds are normal. She exhibits no  distension. There is no splenomegaly. There is no tenderness. There is no rebound and no guarding.  Musculoskeletal: Normal range of motion.  Lymphadenopathy:    She has cervical adenopathy.  Neurological: She is alert and oriented to person, place, and time.  Skin: Skin is warm and dry. No rash noted.  Psychiatric: She has a normal mood and affect. Her behavior is normal. Judgment and thought content normal.    ED Course  Procedures (including critical care time)   Labs Reviewed  POCT RAPID STREP A ([facility] URG CARE ONLY)   No results found.   1. Pharyngitis      MDM  Strep negative. Discussed with patient this is most likely a viral pharyngitis. Supportive treatment. Patient followup with primary care physician of choice as needed. Patient is planned  Kendra Blare, MD 06/19/11 2255

## 2011-06-19 NOTE — Discharge Instructions (Signed)
Take the medication as written. Take 1 gram of tylenol with the motrin up to 4 times a day as needed for pain and fever. This is an effective combination for pain. Take the hydrocodone/norco only for severe pain. Do not take the tylenol and hydrocodone/norcoas they both have tylenol in them and too much can hurt your liver. Return if you get worse, have a  fever >100.4, or for any concerns.   Go to www.goodrx.com to look up your medications. This will give you a list of where you can find your prescriptions at the most affordable prices.   

## 2011-08-07 ENCOUNTER — Encounter (HOSPITAL_COMMUNITY): Payer: Self-pay | Admitting: *Deleted

## 2011-08-07 ENCOUNTER — Emergency Department (HOSPITAL_COMMUNITY): Admission: EM | Admit: 2011-08-07 | Discharge: 2011-08-07 | Discharge status: Against Medical Advice | Payer: 59

## 2011-08-07 NOTE — ED Notes (Signed)
Pt is worried that she has a blood clot.  States when her cousin was 27 yo she had a stroke d/t a blood clot in her leg.  She c/o tenderness of palpation of medial L foot with numbness that shoots up her leg on palpation.  C/o pain to L calf when she bears weight on L leg and numbness in toes when she elevates foot.  Negative homans sign.

## 2011-08-07 NOTE — ED Notes (Signed)
Pt asked where restroom was and never returned from the room.  Waited for twenty minutes and had Aundra Millet, Charity fundraiser ([employer]) call for her in the waiting room with no answer.

## 2011-08-07 NOTE — ED Notes (Signed)
To ED for eval of left leg pain for past cple months. Pt states when she has legs up her toes will go numb. No swelling noted. Pain starts in ankle and moves up leg when she is putting lotion on.

## 2012-09-23 ENCOUNTER — Encounter (HOSPITAL_COMMUNITY): Payer: Self-pay | Admitting: *Deleted

## 2012-09-23 ENCOUNTER — Emergency Department (HOSPITAL_COMMUNITY)
Admission: EM | Admit: 2012-09-23 | Discharge: 2012-09-23 | Discharge status: Against Medical Advice | Payer: 59 | Attending: Emergency Medicine | Admitting: Emergency Medicine

## 2012-09-23 DIAGNOSIS — R51 Headache: Secondary | ICD-10-CM | POA: Insufficient documentation

## 2012-09-23 NOTE — ED Notes (Signed)
Did not answer when called 

## 2012-09-23 NOTE — ED Notes (Addendum)
Pt with headache x 2 weeks.  Has been prescribed butal-acet-caff by pcp with no relief.  Pain is to both temples and feels like pressure (not pain) "as if someone is squeezing my temples and jaw".  Denies nausea, blurred vision or photophobia.

## 2012-09-24 ENCOUNTER — Emergency Department (HOSPITAL_COMMUNITY)
Admission: EM | Admit: 2012-09-24 | Discharge: 2012-09-24 | Disposition: A | Discharge status: Home / Self Care | Payer: 59 | Attending: Emergency Medicine | Admitting: Emergency Medicine

## 2012-09-24 ENCOUNTER — Encounter (HOSPITAL_COMMUNITY): Payer: Self-pay | Admitting: *Deleted

## 2012-09-24 DIAGNOSIS — R42 Dizziness and giddiness: Secondary | ICD-10-CM | POA: Insufficient documentation

## 2012-09-24 DIAGNOSIS — R51 Headache: Secondary | ICD-10-CM | POA: Insufficient documentation

## 2012-09-24 DIAGNOSIS — M542 Cervicalgia: Secondary | ICD-10-CM | POA: Insufficient documentation

## 2012-09-24 DIAGNOSIS — Z8619 Personal history of other infectious and parasitic diseases: Secondary | ICD-10-CM | POA: Insufficient documentation

## 2012-09-24 MED ORDER — KETOROLAC TROMETHAMINE 30 MG/ML IJ SOLN
30.0000 mg | Freq: Once | INTRAMUSCULAR | Status: AC
  Administered 2012-09-24: 30 mg via INTRAMUSCULAR
  Filled 2012-09-24: qty 1

## 2012-09-24 MED ORDER — METOCLOPRAMIDE HCL 5 MG/ML IJ SOLN
10.0000 mg | Freq: Once | INTRAMUSCULAR | Status: AC
  Administered 2012-09-24: 10 mg via INTRAMUSCULAR
  Filled 2012-09-24: qty 2

## 2012-09-24 MED ORDER — DIPHENHYDRAMINE HCL 25 MG PO CAPS
25.0000 mg | ORAL_CAPSULE | Freq: Once | ORAL | Status: AC
  Administered 2012-09-24: 25 mg via ORAL
  Filled 2012-09-24: qty 1

## 2012-09-24 NOTE — ED Notes (Signed)
Pt has had consistent headache to bilateral temporal and down around ears and reports tightness to upper head.  Pt states the pressure in her head hurts real bad at times, sensitive to light and sound.  No visual change

## 2012-09-24 NOTE — ED Provider Notes (Signed)
CSN: [id number]     Arrival date & time 09/24/12  1213 History     First MD Initiated Contact with Patient 09/24/12 1439     Chief Complaint  Patient presents with  . Headache   (Consider location/radiation/quality/duration/timing/severity/associated sxs/prior Treatment) Patient is a 27 y.o. female presenting with headaches.  Headache Associated symptoms: neck pain   Associated symptoms: no abdominal pain, no back pain, no diarrhea, no dizziness, no pain, no fatigue, no fever, no hearing loss, no nausea, no neck stiffness, no numbness, no photophobia, no sinus pressure, no sore throat and no vomiting    Kendra Guerrero is a 27 year old female who presents to the ED for evaluation of HA.  She reports that she has had a B/L HA for the last two weeks.  She was seen by Dr. Marylu Lund Dear and was perscribed Fioricet but has not gotten relief.  She reports that her HA is currently a 9/10 is worse at B/L temporal areas but notes associated tenderness in frontal and occipital areas.  She reports it has gotten more intense over the past 2 weeks. She reports TTP of neck and occipital areas.  She admits some phonophobia but denies photophobia.  She denies any head trama.  And denies any history of HAs in the past.  She denies neck stiffness.  Past Medical History  Diagnosis Date  . Chlamydia   . C. difficile diarrhea 2011  . Clostridium difficile infection 2011   Past Surgical History  Procedure Laterality Date  . Dilation and curettage of uterus     Family History  Problem Relation Age of Onset  . Anesthesia problems Neg Hx   . Hypotension Neg Hx   . Malignant hyperthermia Neg Hx   . Pseudochol deficiency Neg Hx    History  Substance Use Topics  . Smoking status: Never Smoker   . Smokeless tobacco: Not on file  . Alcohol Use: Yes     Comment: occasional   OB History   Grav Para Term Preterm Abortions TAB SAB Ect Mult Living   4 1 1  3 2 1   1      Review of Systems  Constitutional:  Negative for fever, chills, diaphoresis and fatigue.  HENT: Positive for neck pain. Negative for hearing loss, sore throat, neck stiffness, sinus pressure and tinnitus.   Eyes: Negative for photophobia, pain and visual disturbance.  Respiratory: Negative for chest tightness and shortness of breath.   Cardiovascular: Negative for chest pain and palpitations.  Gastrointestinal: Negative for nausea, vomiting, abdominal pain, diarrhea and constipation.  Genitourinary: Negative for dysuria and difficulty urinating.  Musculoskeletal: Negative for back pain.  Neurological: Positive for light-headedness and headaches. Negative for dizziness, weakness and numbness.    Allergies  Review of patient's allergies indicates no known allergies.  Home Medications   Current Outpatient Rx  Name  Route  Sig  Dispense  Refill  . Aspirin-Salicylamide-Caffeine (BC HEADACHE POWDER PO)   Oral   Take 1 packet by mouth daily as needed (for headache).         . butalbital-acetaminophen-caffeine (FIORICET WITH CODEINE) 50-325-40-30 MG per capsule   Oral   Take 1 capsule by mouth every 4 (four) hours as needed for headache or migraine.         Marland Kitchen ibuprofen (ADVIL,MOTRIN) 800 MG tablet   Oral   Take 800 mg by mouth every 8 (eight) hours as needed for pain.         Marland Kitchen  OVER THE COUNTER MEDICATION   Oral   Take 1 tablet by mouth daily as needed (for sinus pain and headache). decongestant          BP 130/93  Pulse 84  Temp(Src) 98.1 F (36.7 C) (Oral)  Resp 18  SpO2 99%  LMP 09/10/2012 Physical Exam  Nursing note and vitals reviewed. Constitutional: She appears well-developed and well-nourished.  HENT:  Head: Normocephalic and atraumatic.  Eyes: EOM are normal. Pupils are equal, round, and reactive to light.  Neck: Normal range of motion.  Cardiovascular: Normal rate, regular rhythm and normal heart sounds.   No murmur heard. Pulmonary/Chest: Effort normal and breath sounds normal. No  respiratory distress. She has no wheezes. She has no rales.  Abdominal: Soft. Bowel sounds are normal. She exhibits no distension. There is no tenderness.  Musculoskeletal: Normal range of motion. She exhibits tenderness (mild TTP R>L cervical paraspinals).  Neurological:  5+ strength in UE and LE with f/e at major joints. Sensation to palpation intact in UE and LE. CNs 2-12 grossly intact.  EOMFI.  PERRL.   Finger nose and coordination intact bilateral.   Visual fields intact to finger testing. No papilledema  Skin: She is not diaphoretic.    ED Course   Procedures (including critical care time)  Labs Reviewed - No data to display No results found. 1. Headache     MDM  27 year old female with no significant PMH who presents for slow onset HA over the past 2 weeks that has not been improved with Fioricet or OTC medications.  Patient has not focal neurologic deficits, no history of trama to the areas, no neck stiffness, no photophobia, no fever or chills, vital signs are stable.  No AMS.  Unlikely to be SAH, Meningitis. By history her HA is consistent with a Tension Type HA. -Will give Toradol, Benadryl, and reglan. 4:20pm Patient feels much better and HA has resolved.  Patient instructed to follow up with PCP in the next few days, warned that if HA continue to persist her PCP may need to refer her for additional imaging studies.  Patient instructed to return to the ED for worsening of her symptoms or if she experiences focal neurologic deficits.  Carlynn Purl, DO 09/24/12 1624  Medical screening examination/treatment/procedure(s) were conducted as a shared visit with non-physician practitioner(s) or resident  and myself.  I personally evaluated the patient during the encounter and agree with the findings and plan unless otherwise indicated.    Well appearing on exam, smiling, no distress.   Gradual onset HA, worse than normal, generalized.  No suspicion for [facility] or meningitis.  No  blood clot hx.  Gradual onset.  Supportive care given.  Normal neuro exam.  HA resolved in ED.  Close fup discussed.   Enid Skeens, MD 09/24/12 [phone]

## 2012-09-24 NOTE — ED Notes (Signed)
MD at bedside. 

## 2012-10-31 ENCOUNTER — Ambulatory Visit: Payer: Self-pay | Admitting: Family Medicine

## 2012-11-04 ENCOUNTER — Emergency Department (HOSPITAL_COMMUNITY): Payer: 59

## 2012-11-04 ENCOUNTER — Encounter (HOSPITAL_COMMUNITY): Payer: Self-pay

## 2012-11-04 ENCOUNTER — Emergency Department (HOSPITAL_COMMUNITY)
Admission: EM | Admit: 2012-11-04 | Discharge: 2012-11-04 | Disposition: A | Discharge status: Home / Self Care | Payer: 59 | Attending: Emergency Medicine | Admitting: Emergency Medicine

## 2012-11-04 DIAGNOSIS — R0789 Other chest pain: Secondary | ICD-10-CM | POA: Insufficient documentation

## 2012-11-04 DIAGNOSIS — IMO0002 Reserved for concepts with insufficient information to code with codable children: Secondary | ICD-10-CM | POA: Insufficient documentation

## 2012-11-04 DIAGNOSIS — R209 Unspecified disturbances of skin sensation: Secondary | ICD-10-CM | POA: Insufficient documentation

## 2012-11-04 DIAGNOSIS — Z3202 Encounter for pregnancy test, result negative: Secondary | ICD-10-CM | POA: Insufficient documentation

## 2012-11-04 DIAGNOSIS — R51 Headache: Secondary | ICD-10-CM | POA: Insufficient documentation

## 2012-11-04 DIAGNOSIS — Z8619 Personal history of other infectious and parasitic diseases: Secondary | ICD-10-CM | POA: Insufficient documentation

## 2012-11-04 LAB — POCT I-STAT, CHEM 8
BUN: 13 mg/dL (ref 6–23)
Chloride: 104 mEq/L (ref 96–112)
Creatinine, Ser: 1.1 mg/dL (ref 0.50–1.10)
HCT: 41 % (ref 36.0–46.0)
Sodium: 141 mEq/L (ref 135–145)

## 2012-11-04 LAB — CBC WITH DIFFERENTIAL/PLATELET
Basophils Absolute: 0 10*3/uL (ref 0.0–0.1)
HCT: 39.6 % (ref 36.0–46.0)
Hemoglobin: 12.8 g/dL (ref 12.0–15.0)
Lymphocytes Relative: 33 % (ref 12–46)
Monocytes Absolute: 0.5 10*3/uL (ref 0.1–1.0)
Neutro Abs: 3.3 10*3/uL (ref 1.7–7.7)
RDW: 13.9 % (ref 11.5–15.5)
WBC: 5.9 10*3/uL (ref 4.0–10.5)

## 2012-11-04 LAB — URINALYSIS, ROUTINE W REFLEX MICROSCOPIC
Bilirubin Urine: NEGATIVE
Leukocytes, UA: NEGATIVE
Nitrite: NEGATIVE
Specific Gravity, Urine: 1.028 (ref 1.005–1.030)
pH: 6 (ref 5.0–8.0)

## 2012-11-04 LAB — POCT PREGNANCY, URINE: Preg Test, Ur: NEGATIVE

## 2012-11-04 MED ORDER — DIAZEPAM 5 MG PO TABS: 5.0000 mg | ORAL_TABLET | Freq: Four times a day (QID) | ORAL | Status: DC | PRN

## 2012-11-04 MED ORDER — FLUTICASONE PROPIONATE 50 MCG/ACT NA SUSP: 2.0000 | Freq: Every day | NASAL | Status: DC

## 2012-11-04 MED ORDER — LEVOFLOXACIN 750 MG PO TABS: 750.0000 mg | ORAL_TABLET | Freq: Every day | ORAL | Status: DC

## 2012-11-04 NOTE — ED Notes (Signed)
Patient transported to X-ray 

## 2012-11-04 NOTE — ED Provider Notes (Signed)
CSN: [id number]     Arrival date & time 11/04/12  1019 History   First MD Initiated Contact with Patient 11/04/12 1055     Chief Complaint  Patient presents with  . Chest Pain  . Numbness  . Headache   (Consider location/radiation/quality/duration/timing/severity/associated sxs/prior Treatment) HPI  Kendra Guerrero is a 27 y.o. female complaining of  sharp chest pain in the sternal area that radiates to the right shoulder x 1 week and right sided facial pressure x 2 months. The chest pain is atypical in nature; it is not aggravated by activity or relieved by rest, and she reports soreness to the touch of the sternal area. She denies injury to the area, shortness of breath, pain in her legs, and the pain is not worsened with deep breathing. She does not smoke; she has no cardiac history. Of more concern to her today is the facial pressure. She states she has had the constant sensation of sinus pressure and tightness of the right side of her face since 2 months ago when she was diagnosed with acute otitis media and treated with a course of amoxicillin by her PCP. Since that time, she has seen her PCP several times and has been to an ENT and has received various treatments including prednisone, flexiril, steroid nasal spray, allergy medications, OTC analgesics and butalbitol with no relief. Her PCP eventually ordered an MRI of her head which was negative. She denies rhinorrhea, sore throat, ear pain, fever and headache.   Past Medical History  Diagnosis Date  . Chlamydia   . C. difficile diarrhea 2011  . Clostridium difficile infection 2011   Past Surgical History  Procedure Laterality Date  . Dilation and curettage of uterus     Family History  Problem Relation Age of Onset  . Anesthesia problems Neg Hx   . Hypotension Neg Hx   . Malignant hyperthermia Neg Hx   . Pseudochol deficiency Neg Hx    History  Substance Use Topics  . Smoking status: Never Smoker   . Smokeless tobacco: Not  on file  . Alcohol Use: Yes     Comment: occasional   OB History   Grav Para Term Preterm Abortions TAB SAB Ect Mult Living   4 1 1  3 2 1   1      Review of Systems 10 systems reviewed and found to be negative, except as noted in the HPI   Allergies  Review of patient's allergies indicates no known allergies.  Home Medications   Current Outpatient Rx  Name  Route  Sig  Dispense  Refill  . phentermine 37.5 MG capsule   Oral   Take 37.5 mg by mouth every morning.         . predniSONE (DELTASONE) 20 MG tablet   Oral   Take 20 mg by mouth daily.          BP 121/75  Pulse 68  Temp(Src) 98.6 F (37 C) (Oral)  Resp 18  SpO2 100%  LMP 10/27/2012 Physical Exam  Nursing note and vitals reviewed. Constitutional: She is oriented to person, place, and time. She appears well-developed and well-nourished. No distress.  HENT:  Head: Normocephalic.  Mouth/Throat: Oropharynx is clear and moist.  Eyes: Conjunctivae and EOM are normal. Pupils are equal, round, and reactive to light.  Neck: Normal range of motion.  Cardiovascular: Normal rate, regular rhythm and intact distal pulses.   Pulmonary/Chest: Effort normal and breath sounds normal. No stridor. No respiratory  distress. She has no wheezes. She has no rales. She exhibits tenderness.    Abdominal: Soft. Bowel sounds are normal. She exhibits no distension and no mass. There is no tenderness. There is no rebound and no guarding.  Musculoskeletal: Normal range of motion.  No calf asymmetry, superficial collaterals, palpable cords, edema, Homans sign negative bilaterally.    Neurological: She is alert and oriented to person, place, and time.  Follows commands, Goal oriented speech, Strength is 5 out of 5x4 extremities, patient ambulates with a coordinated in nonantalgic gait. Sensation is grossly intact.   Psychiatric: She has a normal mood and affect.    ED Course  Procedures (including critical care time) Labs  Review Labs Reviewed  CBC WITH DIFFERENTIAL - Abnormal; Notable for the following:    MCH 25.8 (*)    All other components within normal limits  URINALYSIS, ROUTINE W REFLEX MICROSCOPIC  POCT PREGNANCY, URINE  POCT I-STAT, CHEM 8   Imaging Review Dg Chest 2 View  11/04/2012   CLINICAL DATA:  Mid chest pain increasing for 4 days.  EXAM: CHEST  2 VIEW  COMPARISON:  None.  FINDINGS: The heart size and mediastinal contours are within normal limits. Both lungs are clear. The visualized skeletal structures are unremarkable.  IMPRESSION: No active cardiopulmonary disease.   Electronically Signed   By: Rosalie Gums M.D.   On: 11/04/2012 12:29     Date: 11/04/2012  Rate: 81  Rhythm: normal sinus rhythm  QRS Axis: normal  Intervals: normal  ST/T Wave abnormalities: normal  Conduction Disutrbances:none  Narrative Interpretation:   Old EKG Reviewed: unchanged  MDM   1. Headache   2. Atypical chest pain    Filed Vitals:   11/04/12 1035 11/04/12 1247  BP: 121/75 101/74  Pulse: 68 61  Temp: 98.6 F (37 C)   TempSrc: Oral   Resp: 18 16  SpO2: 100% 100%     Kendra Guerrero is a 27 y.o. female complaining of sharp chest pain, exacerbated by palpation and movement for one week, no associated symptoms, patient also would like evaluation for a right sided facial pressure which she has had for several months, she has had sinusitis treatment with amoxicillin and MRI by her primary care physician with little improvement.  EKG is nonischemic, patient is very low risk for poor cardiac outcome with no family history of early cardiac death or other cardiac risk factors such as diabetes or hypertension. Neuro exam is normal and I have advised her that she may have had incompletely treated sinusitis I will switch her to Levaquin, give her Valium as potential muscle ask her for tension headaches and also struck in fluticasone  Medications - No data to display  Pt is hemodynamically stable,  appropriate for, and amenable to discharge at this time. Pt verbalized understanding and agrees with care plan. All questions answered. Outpatient follow-up and specific return precautions discussed.    Discharge Medication List as of 11/04/2012 12:42 PM    START taking these medications   Details  diazepam (VALIUM) 5 MG tablet Take 1 tablet (5 mg total) by mouth every 6 (six) hours as needed for anxiety (spasms)., Starting 11/04/2012, Until Discontinued, Print    fluticasone (FLONASE) 50 MCG/ACT nasal spray Place 2 sprays into the nose daily., Starting 11/04/2012, Until Discontinued, Print    levofloxacin (LEVAQUIN) 750 MG tablet Take 1 tablet (750 mg total) by mouth daily. X 7 days, Starting 11/04/2012, Until Discontinued, Print  Note: Portions of this report may have been transcribed using voice recognition software. Every effort was made to ensure accuracy; however, inadvertent computerized transcription errors may be present      Wynetta Emery, PA-C 11/04/12 1344

## 2012-11-04 NOTE — ED Notes (Signed)
Pt c/o rt side facial/head pressure/pain x18months, c/o center chest pain/burning radiating upward and to rt arm x1wk

## 2012-11-05 NOTE — ED Provider Notes (Signed)
Medical screening examination/treatment/procedure(s) were performed by non-physician practitioner and as supervising physician I was immediately available for consultation/collaboration.  Toy Baker, MD 11/05/12 [phone]

## 2012-12-09 ENCOUNTER — Emergency Department (HOSPITAL_COMMUNITY)
Admission: EM | Admit: 2012-12-09 | Discharge: 2012-12-09 | Disposition: A | Discharge status: Home / Self Care | Payer: 59 | Source: Home / Self Care

## 2012-12-09 NOTE — ED Notes (Signed)
This pt was called in all waiting areas w/ no response. 

## 2012-12-09 NOTE — ED Notes (Signed)
sw pt. the patient complaining of ear fullness has been to see neurologist,ent has had ct,mri been eval  by opthalmology spec.has been advised she has eustachian tube dysfunction. Has follow up appt  with ent in the am/ I advised her we would be glad to see her but that would not have sophisticated test more that what she has already received. She decidied to see her ENT in the AM

## 2013-01-12 ENCOUNTER — Emergency Department (HOSPITAL_COMMUNITY): Payer: 59

## 2013-01-12 ENCOUNTER — Emergency Department (HOSPITAL_COMMUNITY)
Admission: EM | Admit: 2013-01-12 | Discharge: 2013-01-12 | Disposition: A | Discharge status: Home / Self Care | Payer: 59 | Attending: Emergency Medicine | Admitting: Emergency Medicine

## 2013-01-12 ENCOUNTER — Encounter (HOSPITAL_COMMUNITY): Payer: Self-pay | Admitting: Emergency Medicine

## 2013-01-12 DIAGNOSIS — E860 Dehydration: Secondary | ICD-10-CM | POA: Insufficient documentation

## 2013-01-12 DIAGNOSIS — R5381 Other malaise: Secondary | ICD-10-CM | POA: Insufficient documentation

## 2013-01-12 DIAGNOSIS — IMO0001 Reserved for inherently not codable concepts without codable children: Secondary | ICD-10-CM | POA: Insufficient documentation

## 2013-01-12 DIAGNOSIS — Z3202 Encounter for pregnancy test, result negative: Secondary | ICD-10-CM | POA: Insufficient documentation

## 2013-01-12 DIAGNOSIS — M791 Myalgia, unspecified site: Secondary | ICD-10-CM

## 2013-01-12 DIAGNOSIS — R509 Fever, unspecified: Secondary | ICD-10-CM | POA: Insufficient documentation

## 2013-01-12 DIAGNOSIS — Z8619 Personal history of other infectious and parasitic diseases: Secondary | ICD-10-CM | POA: Insufficient documentation

## 2013-01-12 DIAGNOSIS — R110 Nausea: Secondary | ICD-10-CM | POA: Insufficient documentation

## 2013-01-12 DIAGNOSIS — IMO0002 Reserved for concepts with insufficient information to code with codable children: Secondary | ICD-10-CM | POA: Insufficient documentation

## 2013-01-12 DIAGNOSIS — R51 Headache: Secondary | ICD-10-CM | POA: Insufficient documentation

## 2013-01-12 DIAGNOSIS — R079 Chest pain, unspecified: Secondary | ICD-10-CM | POA: Insufficient documentation

## 2013-01-12 LAB — CBC WITH DIFFERENTIAL/PLATELET
Basophils Relative: 0 % (ref 0–1)
Eosinophils Absolute: 0 10*3/uL (ref 0.0–0.7)
Hemoglobin: 12.7 g/dL (ref 12.0–15.0)
Lymphocytes Relative: 4 % — ABNORMAL LOW (ref 12–46)
Lymphs Abs: 0.5 10*3/uL — ABNORMAL LOW (ref 0.7–4.0)
MCH: 26.2 pg (ref 26.0–34.0)
MCV: 79.4 fL (ref 78.0–100.0)
Monocytes Relative: 6 % (ref 3–12)
Neutrophils Relative %: 90 % — ABNORMAL HIGH (ref 43–77)
Platelets: 252 10*3/uL (ref 150–400)
RBC: 4.85 MIL/uL (ref 3.87–5.11)
WBC: 12.4 10*3/uL — ABNORMAL HIGH (ref 4.0–10.5)

## 2013-01-12 LAB — COMPREHENSIVE METABOLIC PANEL
ALT: 21 U/L (ref 0–35)
Alkaline Phosphatase: 62 U/L (ref 39–117)
BUN: 9 mg/dL (ref 6–23)
CO2: 23 mEq/L (ref 19–32)
GFR calc Af Amer: 87 mL/min — ABNORMAL LOW (ref >90)
GFR calc non Af Amer: 75 mL/min — ABNORMAL LOW (ref >90)
Glucose, Bld: 112 mg/dL — ABNORMAL HIGH (ref 70–99)
Potassium: 3.1 mEq/L — ABNORMAL LOW (ref 3.5–5.1)
Sodium: 133 mEq/L — ABNORMAL LOW (ref 135–145)
Total Bilirubin: 0.3 mg/dL (ref 0.3–1.2)
Total Protein: 7.3 g/dL (ref 6.0–8.3)

## 2013-01-12 LAB — URINALYSIS, ROUTINE W REFLEX MICROSCOPIC
Bilirubin Urine: NEGATIVE
Hgb urine dipstick: NEGATIVE
Ketones, ur: NEGATIVE mg/dL
Leukocytes, UA: NEGATIVE
Nitrite: NEGATIVE
Protein, ur: NEGATIVE mg/dL
Urobilinogen, UA: 1 mg/dL (ref 0.0–1.0)
pH: 6.5 (ref 5.0–8.0)

## 2013-01-12 LAB — POCT PREGNANCY, URINE: Preg Test, Ur: NEGATIVE

## 2013-01-12 LAB — CG4 I-STAT (LACTIC ACID): Lactic Acid, Venous: 2.01 mmol/L (ref 0.5–2.2)

## 2013-01-12 MED ORDER — POTASSIUM CHLORIDE 20 MEQ/15ML (10%) PO LIQD
40.0000 meq | Freq: Once | ORAL | Status: AC
  Administered 2013-01-12: 40 meq via ORAL
  Filled 2013-01-12: qty 30

## 2013-01-12 MED ORDER — KETOROLAC TROMETHAMINE 30 MG/ML IJ SOLN
30.0000 mg | Freq: Once | INTRAMUSCULAR | Status: AC
  Administered 2013-01-12: 30 mg via INTRAVENOUS
  Filled 2013-01-12: qty 1

## 2013-01-12 MED ORDER — ACETAMINOPHEN 500 MG PO TABS
1000.0000 mg | ORAL_TABLET | Freq: Once | ORAL | Status: AC
  Administered 2013-01-12: 1000 mg via ORAL
  Filled 2013-01-12: qty 2

## 2013-01-12 MED ORDER — SODIUM CHLORIDE 0.9 % IV SOLN
1000.0000 mL | Freq: Once | INTRAVENOUS | Status: AC
  Administered 2013-01-12: 1000 mL via INTRAVENOUS

## 2013-01-12 MED ORDER — ONDANSETRON HCL 4 MG/2ML IJ SOLN
4.0000 mg | Freq: Once | INTRAMUSCULAR | Status: AC
  Administered 2013-01-12: 4 mg via INTRAVENOUS
  Filled 2013-01-12: qty 2

## 2013-01-12 MED ORDER — TRAMADOL HCL 50 MG PO TABS: 50.0000 mg | ORAL_TABLET | Freq: Four times a day (QID) | ORAL | Status: DC | PRN

## 2013-01-12 MED ORDER — SODIUM CHLORIDE 0.9 % IV SOLN
1000.0000 mL | INTRAVENOUS | Status: DC
  Administered 2013-01-12: 1000 mL via INTRAVENOUS

## 2013-01-12 NOTE — ED Notes (Signed)
Pt states she has had headache and chest pain for a week states she had been on prednisone and diuretic for nerve damage on right side of ear

## 2013-01-12 NOTE — ED Notes (Signed)
Pt states that she is having a sharp stabbing pain in her mid chest that radiates up the right side of her head.

## 2013-01-12 NOTE — ED Provider Notes (Signed)
TIME SEEN: 8:25 PM  CHIEF COMPLAINT: Headache, chest pain, body aches, fever, nausea  HPI: Patient is a 27 year old female with no significant past medical history who presents the emergency department with one week of body aches, right-sided chest pain, right-sided throbbing headache, nausea, myalgias, malaise, fever. Patient denies any SOB, cough, vomiting or diarrhea, dysuria or hematuria. No sick contacts. No recent travel. She states she did have a flu shot this year. No neck pain or neck stiffness. No numbness, tingling or focal weakness. She denies a sore throat or ear pain.  No rash. 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  ROS: See HPI Constitutional: fever  Eyes: no drainage  ENT: no runny nose   Cardiovascular: chest pain  Resp: no SOB  GI: no vomiting GU: no dysuria Integumentary: no rash  Allergy: no hives  Musculoskeletal: no leg swelling  Neurological: no slurred speech ROS otherwise negative  PAST MEDICAL HISTORY/PAST SURGICAL HISTORY:  Past Medical History  Diagnosis Date  . Chlamydia   . C. difficile diarrhea 2011  . Clostridium difficile infection 2011    MEDICATIONS:  Prior to Admission medications   Medication Sig Start Date End Date Taking? Authorizing Provider  Aspirin-Acetaminophen-Caffeine (EXCEDRIN PO) Take 3 tablets by mouth daily as needed (pain).   Yes Historical Provider, MD  predniSONE (DELTASONE) 20 MG tablet Take 20 mg by mouth daily.   Yes Historical Provider, MD    ALLERGIES:  Allergies  Allergen Reactions  . Apple Anaphylaxis    SOCIAL HISTORY:  History  Substance Use Topics  . Smoking status: Never Smoker   . Smokeless tobacco: Not on file  . Alcohol Use: Yes     Comment: occasional    FAMILY HISTORY: Family History  Problem Relation Age of Onset  . Anesthesia problems Neg Hx   . Hypotension Neg Hx   . Malignant  hyperthermia Neg Hx   . Pseudochol deficiency Neg Hx     EXAM: BP 121/79  Pulse 159  Temp(Src) 102.2 F (39 C) (Oral)  Ht 5\' 4"  (1.626 m)  Wt 220 lb (99.791 kg)  BMI 37.74 kg/m2  SpO2 98%  LMP 01/12/2013 CONSTITUTIONAL: Alert and oriented and responds appropriately to questions. Well-appearing; well-nourished HEAD: Normocephalic EYES: Conjunctivae clear, PERRL ENT: normal nose; no rhinorrhea; moist mucous membranes; pharynx without lesions noted NECK: Supple, no meningismus, no LAD  CARD: Tachycardic; S1 and S2 appreciated; no murmurs, no clicks, no rubs, no gallops RESP: Normal chest excursion without splinting or tachypnea; breath sounds clear and equal bilaterally; no wheezes, no rhonchi, no rales,  ABD/GI: Normal bowel sounds; non-distended; soft, non-tender, no rebound, no guarding BACK:  The back appears normal and is non-tender to palpation, there is no CVA tenderness EXT: Normal ROM in all joints; non-tender to palpation; no edema; normal capillary refill; no cyanosis    SKIN: Normal color for age and race; warm NEURO: Moves all extremities equally; no facial droop or slurred speech PSYCH: The patient's mood and manner are appropriate. Grooming and personal hygiene are appropriate.  MEDICAL DECISION MAKING: Pt here with fever, tachycardia, myalgias, HA, CP.  She is nontoxic appearing on exam. Symptoms have been present for one week. She has no meningeal signs. Will give IV fluids, Toradol, Zofran and check labs including cultures, urine, chest x-ray and flu swab.  ED PROGRESS: Pt reports feeling better.  Her HA has improved as  her fever has improved.  She is still having myalgias.  Labs show mild leukocytosis 12.4 with left shift.  Pt also has mild hypokalemia, will replace.  CXR clear.  Urine pending.  Suspect pt has viral illness, possible influenza.  Will continue to closely monitor.  Given pt has improvement of vitals and symptoms, may be able to discharge patient home which  patient would like.  Urine negative for infection.  Pt's vitals are improved and at baseline after treatment of fever and hydration.  Patient is nontoxic appearing and reports feeling much better and would like discharge home. I feel patient is safe to be discharged home. Given strict return precautions. Given supportive care instructions.  Patient verbalizes understanding and is comfortable with plan    EKG Interpretation    Date/Time:  Sunday January 12 2013 19:42:44 EST Ventricular Rate:  156 PR Interval:  85 QRS Duration: 87 QT Interval:  347 QTC Calculation: 559 R Axis:   89 Text Interpretation:  Sinus tachycardia Atrial premature complex Prolonged QT interval Confirmed by Melinda Gwinner  DO, Daionna Crossland ([id number]) on 01/12/2013 8:00:02 PM                Layla Maw Eleena Grater, DO 01/12/13 2306

## 2013-01-12 NOTE — ED Notes (Signed)
Pt took excederin at 2 pm

## 2013-01-13 LAB — INFLUENZA PANEL BY PCR (TYPE A & B)
Influenza A By PCR: NEGATIVE
Influenza B By PCR: NEGATIVE

## 2013-01-14 LAB — URINE CULTURE

## 2013-01-19 LAB — CULTURE, BLOOD (ROUTINE X 2): Culture: NO GROWTH

## 2013-09-29 ENCOUNTER — Ambulatory Visit: Payer: 59 | Admitting: Family

## 2013-10-14 LAB — OB RESULTS CONSOLE ABO/RH: RH Type: POSITIVE

## 2013-10-14 LAB — OB RESULTS CONSOLE RUBELLA ANTIBODY, IGM: Rubella: IMMUNE

## 2013-10-14 LAB — OB RESULTS CONSOLE ANTIBODY SCREEN: Antibody Screen: NEGATIVE

## 2013-10-14 LAB — OB RESULTS CONSOLE HIV ANTIBODY (ROUTINE TESTING): HIV: NONREACTIVE

## 2013-10-14 LAB — OB RESULTS CONSOLE RPR: RPR: NONREACTIVE

## 2013-10-14 LAB — OB RESULTS CONSOLE GC/CHLAMYDIA
Chlamydia: NEGATIVE
Gonorrhea: NEGATIVE

## 2013-10-14 LAB — OB RESULTS CONSOLE HEPATITIS B SURFACE ANTIGEN: Hepatitis B Surface Ag: NEGATIVE

## 2013-12-15 ENCOUNTER — Encounter (HOSPITAL_COMMUNITY): Payer: Self-pay | Admitting: Emergency Medicine

## 2014-02-13 NOTE — L&D Delivery Note (Signed)
Delivery Note Pt progressed to complete and pushed well.  At 6:36 AM a viable female was delivered via Vaginal, Spontaneous Delivery (Presentation: Left Occiput Anterior).  APGAR: 8, 8; weight pending.  NICU team at delivery for meconium, baby with spontaneous cry.   Placenta status: Intact, Spontaneous.  Cord: 3 vessels with the following complications: None.  Anesthesia: Epidural  Episiotomy: None Lacerations:  Small periurethral Suture Repair: none Est. Blood Loss (mL): 300  Mom to postpartum.  Baby to Couplet care / Skin to Skin.  Kendra Guerrero D 05/10/2014, 6:50 AM

## 2014-04-10 LAB — OB RESULTS CONSOLE GBS: GBS: NEGATIVE

## 2014-05-06 ENCOUNTER — Telehealth (HOSPITAL_COMMUNITY): Payer: Self-pay | Admitting: *Deleted

## 2014-05-06 ENCOUNTER — Encounter (HOSPITAL_COMMUNITY): Payer: Self-pay | Admitting: *Deleted

## 2014-05-06 NOTE — Telephone Encounter (Signed)
Preadmission screen  

## 2014-05-09 ENCOUNTER — Inpatient Hospital Stay (HOSPITAL_COMMUNITY)
Admission: AD | Admit: 2014-05-09 | Discharge: 2014-05-11 | DRG: 775 | Disposition: A | Discharge status: Home / Self Care | Payer: Medicaid Other | Source: Ambulatory Visit | Attending: Obstetrics and Gynecology | Admitting: Obstetrics and Gynecology

## 2014-05-09 ENCOUNTER — Encounter (HOSPITAL_COMMUNITY): Payer: Self-pay | Admitting: *Deleted

## 2014-05-09 DIAGNOSIS — Z3A39 39 weeks gestation of pregnancy: Secondary | ICD-10-CM | POA: Diagnosis present

## 2014-05-09 DIAGNOSIS — Z3483 Encounter for supervision of other normal pregnancy, third trimester: Secondary | ICD-10-CM | POA: Diagnosis present

## 2014-05-09 DIAGNOSIS — Z3490 Encounter for supervision of normal pregnancy, unspecified, unspecified trimester: Secondary | ICD-10-CM

## 2014-05-09 DIAGNOSIS — Z3689 Encounter for other specified antenatal screening: Secondary | ICD-10-CM | POA: Insufficient documentation

## 2014-05-09 DIAGNOSIS — IMO0001 Reserved for inherently not codable concepts without codable children: Secondary | ICD-10-CM

## 2014-05-09 LAB — CBC
HEMATOCRIT: 32.3 % — AB (ref 36.0–46.0)
HEMOGLOBIN: 10.5 g/dL — AB (ref 12.0–15.0)
MCH: 25.9 pg — ABNORMAL LOW (ref 26.0–34.0)
MCHC: 32.5 g/dL (ref 30.0–36.0)
MCV: 79.6 fL (ref 78.0–100.0)
Platelets: 249 10*3/uL (ref 150–400)
RBC: 4.06 MIL/uL (ref 3.87–5.11)
RDW: 15.1 % (ref 11.5–15.5)
WBC: 7.6 10*3/uL (ref 4.0–10.5)

## 2014-05-09 LAB — POCT FERN TEST: POCT FERN TEST: POSITIVE

## 2014-05-09 LAB — TYPE AND SCREEN
ABO/RH(D): A POS
Antibody Screen: NEGATIVE

## 2014-05-09 MED ORDER — ONDANSETRON HCL 4 MG/2ML IJ SOLN: 4.0000 mg | Freq: Four times a day (QID) | INTRAMUSCULAR | Status: DC | PRN

## 2014-05-09 MED ORDER — LACTATED RINGERS IV SOLN
INTRAVENOUS | Status: DC
  Administered 2014-05-09: 23:00:00 via INTRAVENOUS

## 2014-05-09 MED ORDER — OXYCODONE-ACETAMINOPHEN 5-325 MG PO TABS: 2.0000 | ORAL_TABLET | ORAL | Status: DC | PRN

## 2014-05-09 MED ORDER — LACTATED RINGERS IV SOLN: 500.0000 mL | INTRAVENOUS | Status: DC | PRN

## 2014-05-09 MED ORDER — OXYTOCIN 40 UNITS IN LACTATED RINGERS INFUSION - SIMPLE MED
62.5000 mL/h | INTRAVENOUS | Status: DC
  Filled 2014-05-09: qty 1000

## 2014-05-09 MED ORDER — ACETAMINOPHEN 325 MG PO TABS: 650.0000 mg | ORAL_TABLET | ORAL | Status: DC | PRN

## 2014-05-09 MED ORDER — OXYTOCIN BOLUS FROM INFUSION
500.0000 mL | INTRAVENOUS | Status: DC
  Administered 2014-05-10: 500 mL via INTRAVENOUS

## 2014-05-09 MED ORDER — OXYCODONE-ACETAMINOPHEN 5-325 MG PO TABS: 1.0000 | ORAL_TABLET | ORAL | Status: DC | PRN

## 2014-05-09 MED ORDER — FLEET ENEMA 7-19 GM/118ML RE ENEM: 1.0000 | ENEMA | RECTAL | Status: DC | PRN

## 2014-05-09 MED ORDER — CITRIC ACID-SODIUM CITRATE 334-500 MG/5ML PO SOLN: 30.0000 mL | ORAL | Status: DC | PRN

## 2014-05-09 MED ORDER — LIDOCAINE HCL (PF) 1 % IJ SOLN
30.0000 mL | INTRAMUSCULAR | Status: DC | PRN
  Filled 2014-05-09: qty 30

## 2014-05-09 NOTE — MAU Note (Signed)
Pt stated she lost her mucus plug this morning and then started having fluid leak out about an hour ago tha t has not stopped. C/o mild ctx and reports good fetal movement

## 2014-05-10 ENCOUNTER — Inpatient Hospital Stay (HOSPITAL_COMMUNITY): Payer: Medicaid Other | Admitting: Anesthesiology

## 2014-05-10 ENCOUNTER — Inpatient Hospital Stay (HOSPITAL_COMMUNITY): Payer: Medicaid Other

## 2014-05-10 ENCOUNTER — Encounter (HOSPITAL_COMMUNITY): Payer: Self-pay

## 2014-05-10 LAB — RPR: RPR: NONREACTIVE

## 2014-05-10 MED ORDER — TETANUS-DIPHTH-ACELL PERTUSSIS 5-2.5-18.5 LF-MCG/0.5 IM SUSP: 0.5000 mL | Freq: Once | INTRAMUSCULAR | Status: DC

## 2014-05-10 MED ORDER — METHYLERGONOVINE MALEATE 0.2 MG PO TABS
0.2000 mg | ORAL_TABLET | ORAL | Status: DC | PRN
Start: 2014-05-10 — End: 2014-05-11

## 2014-05-10 MED ORDER — ONDANSETRON HCL 4 MG/2ML IJ SOLN: 4.0000 mg | INTRAMUSCULAR | Status: DC | PRN

## 2014-05-10 MED ORDER — BUTORPHANOL TARTRATE 1 MG/ML IJ SOLN
1.0000 mg | INTRAMUSCULAR | Status: DC | PRN
  Administered 2014-05-10: 1 mg via INTRAVENOUS
  Filled 2014-05-10: qty 1

## 2014-05-10 MED ORDER — DIPHENHYDRAMINE HCL 25 MG PO CAPS: 25.0000 mg | ORAL_CAPSULE | Freq: Four times a day (QID) | ORAL | Status: DC | PRN

## 2014-05-10 MED ORDER — DIBUCAINE 1 % RE OINT: 1.0000 "application " | TOPICAL_OINTMENT | RECTAL | Status: DC | PRN

## 2014-05-10 MED ORDER — PHENYLEPHRINE 40 MCG/ML (10ML) SYRINGE FOR IV PUSH (FOR BLOOD PRESSURE SUPPORT)
80.0000 ug | PREFILLED_SYRINGE | INTRAVENOUS | Status: DC | PRN
  Filled 2014-05-10: qty 2

## 2014-05-10 MED ORDER — PRENATAL MULTIVITAMIN CH
1.0000 | ORAL_TABLET | Freq: Every day | ORAL | Status: DC
  Administered 2014-05-10 – 2014-05-11 (×2): 1 via ORAL
  Filled 2014-05-10 (×2): qty 1

## 2014-05-10 MED ORDER — ZOLPIDEM TARTRATE 5 MG PO TABS: 5.0000 mg | ORAL_TABLET | Freq: Every evening | ORAL | Status: DC | PRN

## 2014-05-10 MED ORDER — METHYLERGONOVINE MALEATE 0.2 MG/ML IJ SOLN: 0.2000 mg | INTRAMUSCULAR | Status: DC | PRN

## 2014-05-10 MED ORDER — OXYCODONE-ACETAMINOPHEN 5-325 MG PO TABS
2.0000 | ORAL_TABLET | ORAL | Status: DC | PRN
  Administered 2014-05-10: 2 via ORAL

## 2014-05-10 MED ORDER — SENNOSIDES-DOCUSATE SODIUM 8.6-50 MG PO TABS
2.0000 | ORAL_TABLET | ORAL | Status: DC
  Administered 2014-05-11: 2 via ORAL
  Filled 2014-05-10: qty 2

## 2014-05-10 MED ORDER — LACTATED RINGERS IV SOLN
500.0000 mL | Freq: Once | INTRAVENOUS | Status: AC
  Administered 2014-05-10: 500 mL via INTRAVENOUS

## 2014-05-10 MED ORDER — OXYCODONE-ACETAMINOPHEN 5-325 MG PO TABS
1.0000 | ORAL_TABLET | ORAL | Status: DC | PRN
  Filled 2014-05-10 (×2): qty 1

## 2014-05-10 MED ORDER — SIMETHICONE 80 MG PO CHEW: 80.0000 mg | CHEWABLE_TABLET | ORAL | Status: DC | PRN

## 2014-05-10 MED ORDER — MEASLES, MUMPS & RUBELLA VAC ~~LOC~~ INJ
0.5000 mL | INJECTION | Freq: Once | SUBCUTANEOUS | Status: DC
  Filled 2014-05-10: qty 0.5

## 2014-05-10 MED ORDER — EPHEDRINE 5 MG/ML INJ
10.0000 mg | INTRAVENOUS | Status: DC | PRN
  Filled 2014-05-10: qty 2

## 2014-05-10 MED ORDER — BENZOCAINE-MENTHOL 20-0.5 % EX AERO
1.0000 "application " | INHALATION_SPRAY | CUTANEOUS | Status: DC | PRN
  Administered 2014-05-10: 1 via TOPICAL
  Filled 2014-05-10: qty 56

## 2014-05-10 MED ORDER — FENTANYL 2.5 MCG/ML BUPIVACAINE 1/10 % EPIDURAL INFUSION (WH - ANES)
INTRAMUSCULAR | Status: DC | PRN
  Administered 2014-05-10: 14 mL/h via EPIDURAL

## 2014-05-10 MED ORDER — LIDOCAINE HCL (PF) 1 % IJ SOLN
INTRAMUSCULAR | Status: DC | PRN
  Administered 2014-05-10: 3 mL
  Administered 2014-05-10 (×2): 5 mL

## 2014-05-10 MED ORDER — WITCH HAZEL-GLYCERIN EX PADS: 1.0000 "application " | MEDICATED_PAD | CUTANEOUS | Status: DC | PRN

## 2014-05-10 MED ORDER — ACETAMINOPHEN 325 MG PO TABS: 650.0000 mg | ORAL_TABLET | ORAL | Status: DC | PRN

## 2014-05-10 MED ORDER — LANOLIN HYDROUS EX OINT: TOPICAL_OINTMENT | CUTANEOUS | Status: DC | PRN

## 2014-05-10 MED ORDER — DIPHENHYDRAMINE HCL 50 MG/ML IJ SOLN: 12.5000 mg | INTRAMUSCULAR | Status: DC | PRN

## 2014-05-10 MED ORDER — IBUPROFEN 600 MG PO TABS
600.0000 mg | ORAL_TABLET | Freq: Four times a day (QID) | ORAL | Status: DC
  Administered 2014-05-10 – 2014-05-11 (×5): 600 mg via ORAL
  Filled 2014-05-10 (×5): qty 1

## 2014-05-10 MED ORDER — PHENYLEPHRINE 40 MCG/ML (10ML) SYRINGE FOR IV PUSH (FOR BLOOD PRESSURE SUPPORT)
80.0000 ug | PREFILLED_SYRINGE | INTRAVENOUS | Status: DC | PRN
  Filled 2014-05-10: qty 2
  Filled 2014-05-10: qty 20

## 2014-05-10 MED ORDER — FENTANYL 2.5 MCG/ML BUPIVACAINE 1/10 % EPIDURAL INFUSION (WH - ANES)
14.0000 mL/h | INTRAMUSCULAR | Status: DC | PRN
  Administered 2014-05-10: 14 mL/h via EPIDURAL
  Filled 2014-05-10: qty 125

## 2014-05-10 MED ORDER — MAGNESIUM HYDROXIDE 400 MG/5ML PO SUSP: 30.0000 mL | ORAL | Status: DC | PRN

## 2014-05-10 MED ORDER — ONDANSETRON HCL 4 MG PO TABS: 4.0000 mg | ORAL_TABLET | ORAL | Status: DC | PRN

## 2014-05-10 NOTE — Anesthesia Procedure Notes (Signed)
Epidural Patient location during procedure: OB  Staffing Anesthesiologist: Montez Hageman Performed by: anesthesiologist   Preanesthetic Checklist Completed: patient identified, site marked, surgical consent, pre-op evaluation, timeout performed, IV checked, risks and benefits discussed and monitors and equipment checked  Epidural Patient position: sitting Prep: ChloraPrep Patient monitoring: heart rate, continuous pulse ox and blood pressure Approach: right paramedian Location: L3-L4 Injection technique: LOR saline  Needle:  Needle type: Tuohy  Needle gauge: 17 G Needle length: 9 cm and 9 Needle insertion depth: 7 cm Catheter type: closed end flexible Catheter size: 20 Guage Catheter at skin depth: 13 cm Test dose: negative  Assessment Events: blood not aspirated, injection not painful, no injection resistance, negative IV test and no paresthesia  Additional Notes   Patient tolerated the insertion well without complications.

## 2014-05-10 NOTE — Lactation Note (Signed)
This note was copied from the chart of [place]. [name] Date: 05/10/2014 Reason for consult: Initial assessment Baby 9 hours of life. Mom states that she did not nurse her first baby, but her milk did come in. Assisted mom to latch baby in football position. Mom return-demonstrated hand expression with colostrum present. Baby latched deeply, suckling rhythmically with a few swallows noted. Enc mom to nurse with cues, and offer lots of STS. Mom given [facility] brochure, aware of OP/BFSG, community resources, and [facility] phone line assistance after D/C.  Maternal Data Has patient been taught Hand Expression?: Yes Does the patient have breastfeeding experience prior to this delivery?: No  Feeding Feeding Type: Breast Fed Length of feed:  (LC assessed first 15 minutes of BF.)  LATCH Score/Interventions Latch: Grasps breast easily, tongue down, lips flanged, rhythmical sucking. Intervention(s): Adjust position;Assist with latch;Breast compression  Audible Swallowing: A few with stimulation Intervention(s): Hand expression  Type of Nipple: Everted at rest and after stimulation  Comfort (Breast/Nipple): Soft / non-tender     Hold (Positioning): Assistance needed to correctly position infant at breast and maintain latch. Intervention(s): Breastfeeding basics reviewed;Support Pillows  LATCH Score: 8  Lactation Tools Discussed/Used     Consult Status Consult Status: Follow-up Date: 05/11/14 Follow-up type: In-patient    Inocente Salles 05/10/2014, 4:13 PM

## 2014-05-10 NOTE — Anesthesia Preprocedure Evaluation (Signed)
Anesthesia Evaluation  Patient identified by MRN, date of birth, ID band Patient awake    Reviewed: Allergy & Precautions, H&P , NPO status , Patient's Chart, lab work & pertinent test results  History of Anesthesia Complications Negative for: history of anesthetic complications  Airway Mallampati: II  TM Distance: >3 FB Neck ROM: full    Dental no notable dental hx. (+) Teeth Intact   Pulmonary neg pulmonary ROS,  breath sounds clear to auscultation  Pulmonary exam normal       Cardiovascular negative cardio ROS  Rhythm:regular Rate:Normal     Neuro/Psych negative neurological ROS  negative psych ROS   GI/Hepatic negative GI ROS, Neg liver ROS,   Endo/Other  negative endocrine ROSMorbid obesity  Renal/GU negative Renal ROS  negative genitourinary   Musculoskeletal   Abdominal Normal abdominal exam  (+)   Peds  Hematology negative hematology ROS (+)   Anesthesia Other Findings   Reproductive/Obstetrics (+) Pregnancy                             Anesthesia Physical Anesthesia Plan  ASA: II  Anesthesia Plan: Epidural   Post-op Pain Management:    Induction:   Airway Management Planned:   Additional Equipment:   Intra-op Plan:   Post-operative Plan:   Informed Consent: I have reviewed the patients History and Physical, chart, labs and discussed the procedure including the risks, benefits and alternatives for the proposed anesthesia with the patient or authorized representative who has indicated his/her understanding and acceptance.     Plan Discussed with:   Anesthesia Plan Comments:         Anesthesia Quick Evaluation

## 2014-05-10 NOTE — Anesthesia Postprocedure Evaluation (Signed)
  Anesthesia Post-op Note  Patient: Kendra Guerrero  Procedure(s) Performed: * No procedures listed *  Patient Location: Mother/Baby  Anesthesia Type:Epidural  Level of Consciousness: awake  Airway and Oxygen Therapy: Patient Spontanous Breathing  Post-op Pain: mild  Post-op Assessment: Patient's Cardiovascular Status Stable and Respiratory Function Stable  Post-op Vital Signs: stable  Last Vitals:  Filed Vitals:   05/10/14 1345  BP: 114/61  Pulse: 76  Temp: 36.7 C  Resp: 18    Complications: No apparent anesthesia complications

## 2014-05-10 NOTE — H&P (Signed)
Kendra Guerrero is a 29 y.o. female, G4 P1021, EGA 39+ weeks with EDC 3-28 presenting for leaking fluid and ctx.  On eval in MAU, ROM confirmed with meconium, irreg ctx.  Pt admitted, has continued to contract on her own, comfortable with epidural.  Prenatal care essentially uncomplicated, see prenatal records for complete history.  Maternal Medical History:  Reason for admission: Rupture of membranes and contractions.   Contractions: Frequency: irregular.   Perceived severity is mild.    Fetal activity: Perceived fetal activity is normal.    Prenatal complications: no prenatal complications   OB History    Gravida Para Term Preterm AB TAB SAB Ectopic Multiple Living   4 1 1  2 1 1   1      Past Medical History  Diagnosis Date  . Chlamydia   . C. difficile diarrhea 2011  . Clostridium difficile infection 2011  . Hx of pyelonephritis    Past Surgical History  Procedure Laterality Date  . Dilation and curettage of uterus     Family History: family history includes Diabetes in her mother; Hypertension in her maternal grandmother; Stroke in her mother. There is no history of Anesthesia problems, Hypotension, Malignant hyperthermia, or Pseudochol deficiency. Social History:  reports that she has never smoked. She has never used smokeless tobacco. She reports that she drinks alcohol. She reports that she does not use illicit drugs.   Prenatal Transfer Tool  Maternal Diabetes: No Genetic Screening: Normal Maternal Ultrasounds/Referrals: Normal Fetal Ultrasounds or other Referrals:  None Maternal Substance Abuse:  No Significant Maternal Medications:  None Significant Maternal Lab Results:  Lab values include: Group B Strep negative Other Comments:  None  Review of Systems  Respiratory: Negative.   Cardiovascular: Negative.    VE- now 9/C/+1, vtx Dilation: 7 Effacement (%): 80 Station: -3 Exam by:: Gaspar Garbe, RN  Blood pressure 98/76, pulse 82, temperature 98.3 F (36.8 C),  temperature source Oral, resp. rate 16, height 5\' 4"  (1.626 m), weight 124.286 kg (274 lb), last menstrual period 08/04/2013, SpO2 100 %. Maternal Exam:  Uterine Assessment: Contraction strength is moderate.  Contraction frequency is regular.   Abdomen: Patient reports no abdominal tenderness. Estimated fetal weight is 8 lbs.   Fetal presentation: vertex  Introitus: Normal vulva. Normal vagina.  Ferning test: positive.  Amniotic fluid character: meconium stained.  Pelvis: adequate for delivery.   Cervix: Cervix evaluated by sterile speculum exam.     Fetal Exam Fetal Monitor Review: Mode: ultrasound.   Baseline rate: 140.  Variability: moderate (6-25 bpm).   Pattern: accelerations present and variable decelerations.    Fetal State Assessment: Category II - tracings are indeterminate.     Physical Exam  Vitals reviewed. Constitutional: She appears well-developed and well-nourished.  Cardiovascular: Normal rate, regular rhythm and normal heart sounds.   No murmur heard. Respiratory: Effort normal and breath sounds normal. No respiratory distress.  GI: Soft.    Prenatal labs: ABO, Rh: --/--/A POS (03/26 2245) Antibody: NEG (03/26 2245) Rubella: Immune (09/01 0000) RPR: Nonreactive (09/01 0000)  HBsAg: Negative (09/01 0000)  HIV: Non-reactive (09/01 0000)  GBS: Negative (02/26 0000)   Assessment/Plan: IUP at 39+ weeks with ROM in active labor with meconium.  Making good progress, will monitor progress and anticipate SVD.  Will have NICU team at delivery for meconium.     Cystal Shannahan D 05/10/2014, 5:37 AM

## 2014-05-11 MED ORDER — IBUPROFEN 600 MG PO TABS: 600.0000 mg | ORAL_TABLET | Freq: Four times a day (QID) | ORAL | Status: DC

## 2014-05-11 MED ORDER — OXYCODONE-ACETAMINOPHEN 5-325 MG PO TABS: 1.0000 | ORAL_TABLET | ORAL | Status: DC | PRN

## 2014-05-11 NOTE — Lactation Note (Signed)
This note was copied from the chart of [place]. Lactation Consultation Note  Follow up visit made prior to discharge.  Mom is worried baby is not getting enough.  She states he acts hungry shortly after feedings.  Baby is 84 hours old.  Discussed cluster feeding and milk coming to volume.  Breasts are becoming full and mom can easily hand express milk.  Observed mom latch baby to right breast using football hold.  Baby latches easily and deep.  Good active suck/swallows noted.  Mom has a DEBP at home.  Reassured and praised mom.  Lactation outpatient support and services encouraged.  Patient Name: Kendra Guerrero [id number] Date: 05/11/2014 Reason for consult: Follow-up assessment   Maternal Data    Feeding Feeding Type: Breast Fed Length of feed: 20 min  LATCH Score/Interventions Latch: Grasps breast easily, tongue down, lips flanged, rhythmical sucking. Intervention(s): Adjust position;Assist with latch;Breast massage;Breast compression  Audible Swallowing: Spontaneous and intermittent Intervention(s): Alternate breast massage;Hand expression  Type of Nipple: Everted at rest and after stimulation  Comfort (Breast/Nipple): Soft / non-tender     Hold (Positioning): No assistance needed to correctly position infant at breast. Intervention(s): Breastfeeding basics reviewed;Support Pillows;Position options  LATCH Score: 10  Lactation Tools Discussed/Used Tools: Comfort gels   Consult Status Consult Status: Complete    Ave Filter 05/11/2014, 11:55 AM

## 2014-05-11 NOTE — Progress Notes (Signed)
Patient ID: Kendra Guerrero, female   DOB: 19-Jun-1985, 29 y.o.   MRN: [id number] PPD #1 No problems, wants to go home Afeb, VSS Fundus firm, NT at U-1 Continue routine postpartum care, d/c home today

## 2014-05-11 NOTE — Discharge Summary (Signed)
Obstetric Discharge Summary Reason for Admission: onset of labor and rupture of membranes Prenatal Procedures: none Intrapartum Procedures: spontaneous vaginal delivery Postpartum Procedures: none Complications-Operative and Postpartum: none HEMOGLOBIN  Date Value Ref Range Status  05/09/2014 10.5* 12.0 - 15.0 g/dL Final   HCT  Date Value Ref Range Status  05/09/2014 32.3* 36.0 - 46.0 % Final    Physical Exam:  General: alert Lochia: appropriate Uterine Fundus: firm   Discharge Diagnoses: Term Pregnancy-delivered  Discharge Information: Date: 05/11/2014 Activity: pelvic rest Diet: routine Medications: Ibuprofen and Percocet Condition: stable Instructions: refer to practice specific booklet Discharge to: home Follow-up Information    Follow up with Navraj Dreibelbis D, MD. Schedule an appointment as soon as possible for a visit in 6 weeks.   Specialty:  Obstetrics and Gynecology   Contact information:   [address], [place] [address] [phone]       Newborn Data: Live born female  Birth Weight: 8 lb 10.6 oz (3930 g) APGAR: 8, 8  Home with mother.  Damian Buckles D 05/11/2014, 9:43 AM

## 2014-05-11 NOTE — Discharge Instructions (Signed)
As per discharge pamphlet °

## 2014-05-11 NOTE — Progress Notes (Signed)
UR chart review completed.  

## 2014-05-13 ENCOUNTER — Inpatient Hospital Stay (HOSPITAL_COMMUNITY): Admission: RE | Admit: 2014-05-13 | Payer: Medicaid Other | Source: Ambulatory Visit

## 2014-05-15 DIAGNOSIS — Z3689 Encounter for other specified antenatal screening: Secondary | ICD-10-CM | POA: Insufficient documentation

## 2014-05-15 DIAGNOSIS — Z3A39 39 weeks gestation of pregnancy: Secondary | ICD-10-CM | POA: Insufficient documentation

## 2014-10-12 ENCOUNTER — Encounter (HOSPITAL_COMMUNITY): Payer: Self-pay | Admitting: Emergency Medicine

## 2014-10-12 ENCOUNTER — Emergency Department (HOSPITAL_COMMUNITY): Payer: Medicaid Other

## 2014-10-12 ENCOUNTER — Emergency Department (HOSPITAL_COMMUNITY)
Admission: EM | Admit: 2014-10-12 | Discharge: 2014-10-12 | Disposition: A | Discharge status: Home / Self Care | Payer: Medicaid Other | Attending: Emergency Medicine | Admitting: Emergency Medicine

## 2014-10-12 DIAGNOSIS — Y998 Other external cause status: Secondary | ICD-10-CM | POA: Diagnosis not present

## 2014-10-12 DIAGNOSIS — M26629 Arthralgia of temporomandibular joint, unspecified side: Secondary | ICD-10-CM

## 2014-10-12 DIAGNOSIS — Z87448 Personal history of other diseases of urinary system: Secondary | ICD-10-CM | POA: Diagnosis not present

## 2014-10-12 DIAGNOSIS — Y9289 Other specified places as the place of occurrence of the external cause: Secondary | ICD-10-CM | POA: Diagnosis not present

## 2014-10-12 DIAGNOSIS — Z8619 Personal history of other infectious and parasitic diseases: Secondary | ICD-10-CM | POA: Insufficient documentation

## 2014-10-12 DIAGNOSIS — Z79899 Other long term (current) drug therapy: Secondary | ICD-10-CM | POA: Diagnosis not present

## 2014-10-12 DIAGNOSIS — Y9389 Activity, other specified: Secondary | ICD-10-CM | POA: Insufficient documentation

## 2014-10-12 DIAGNOSIS — S0993XA Unspecified injury of face, initial encounter: Secondary | ICD-10-CM | POA: Insufficient documentation

## 2014-10-12 MED ORDER — NAPROXEN 500 MG PO TABS: 500.0000 mg | ORAL_TABLET | Freq: Two times a day (BID) | ORAL | Status: DC

## 2014-10-12 NOTE — Discharge Instructions (Signed)
Your CT shows no fractures or dislocations.  Your symptoms are most likely due to a joint strain in your jaw. Ice. Naprosyn for pain. Follow up as needed. It may take up to few weeks for your pain to improve.   Temporomandibular Problems  Temporomandibular joint (TMJ) dysfunction means there are problems with the joint between your jaw and your skull. This is a joint lined by cartilage like other joints in your body but also has a small disc in the joint which keeps the bones from rubbing on each other. These joints are like other joints and can get inflamed (sore) from arthritis and other problems. When this joint gets sore, it can cause headaches and pain in the jaw and the face. CAUSES  Usually the arthritic types of problems are caused by soreness in the joint. Soreness in the joint can also be caused by overuse. This may come from grinding your teeth. It may also come from mis-alignment in the joint. DIAGNOSIS Diagnosis of this condition can often be made by history and exam. Sometimes your caregiver may need X-rays or an MRI scan to determine the exact cause. It may be necessary to see your dentist to determine if your teeth and jaws are lined up correctly. TREATMENT  Most of the time this problem is not serious; however, sometimes it can persist (become chronic). When this happens medications that will cut down on inflammation (soreness) help. Sometimes a shot of cortisone into the joint will be helpful. If your teeth are not aligned it may help for your dentist to make a splint for your mouth that can help this problem. If no physical problems can be found, the problem may come from tension. If tension is found to be the cause, biofeedback or relaxation techniques may be helpful. HOME CARE INSTRUCTIONS   Later in the day, applications of ice packs may be helpful. Ice can be used in a plastic bag with a towel around it to prevent frostbite to skin. This may be used about every 2 hours for 20 to 30  minutes, as needed while awake, or as directed by your caregiver.  Only take over-the-counter or prescription medicines for pain, discomfort, or fever as directed by your caregiver.  If physical therapy was prescribed, follow your caregiver's directions.  Wear mouth appliances as directed if they were given. Document Released: 10/25/2000 Document Revised: 04/24/2011 Document Reviewed: 02/02/2008 [facility] Patient Information 2015 [place], [place]. This information is not intended to replace advice given to you by your health care provider. Make sure you discuss any questions you have with your health care provider.
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
## 2014-10-12 NOTE — ED Notes (Signed)
Patient transported to CT 

## 2015-06-14 ENCOUNTER — Encounter (HOSPITAL_COMMUNITY): Payer: Self-pay | Admitting: *Deleted

## 2015-06-14 ENCOUNTER — Emergency Department (HOSPITAL_COMMUNITY)
Admission: EM | Admit: 2015-06-14 | Discharge: 2015-06-15 | Disposition: A | Discharge status: Home / Self Care | Payer: Medicaid Other | Attending: Emergency Medicine | Admitting: Emergency Medicine

## 2015-06-14 DIAGNOSIS — R202 Paresthesia of skin: Secondary | ICD-10-CM | POA: Insufficient documentation

## 2015-06-14 DIAGNOSIS — R200 Anesthesia of skin: Secondary | ICD-10-CM | POA: Diagnosis not present

## 2015-06-14 NOTE — ED Notes (Signed)
Pt states that she began having intense pressure to her head around 5pm; pt denies pain; pt c/o numbness and tingling to the rt side of her face; pt with normal facial movements, no droop or slur noted; pt states that she has had this once before but never was elevated for it; pt denies injury; no weakness noted; pt ambulatory with steady gait

## 2016-02-28 DIAGNOSIS — IMO0002 Reserved for concepts with insufficient information to code with codable children: Secondary | ICD-10-CM | POA: Insufficient documentation

## 2016-04-21 DIAGNOSIS — M26629 Arthralgia of temporomandibular joint, unspecified side: Secondary | ICD-10-CM | POA: Insufficient documentation

## 2016-05-19 DIAGNOSIS — R600 Localized edema: Secondary | ICD-10-CM | POA: Insufficient documentation

## 2016-10-06 DIAGNOSIS — J301 Allergic rhinitis due to pollen: Secondary | ICD-10-CM | POA: Insufficient documentation

## 2016-10-06 DIAGNOSIS — L910 Hypertrophic scar: Secondary | ICD-10-CM | POA: Insufficient documentation

## 2016-10-06 DIAGNOSIS — J309 Allergic rhinitis, unspecified: Secondary | ICD-10-CM | POA: Insufficient documentation

## 2017-02-05 DIAGNOSIS — R87612 Low grade squamous intraepithelial lesion on cytologic smear of cervix (LGSIL): Secondary | ICD-10-CM | POA: Insufficient documentation

## 2017-06-13 DIAGNOSIS — R7303 Prediabetes: Secondary | ICD-10-CM | POA: Diagnosis present

## 2019-06-07 DIAGNOSIS — N926 Irregular menstruation, unspecified: Secondary | ICD-10-CM | POA: Insufficient documentation

## 2019-06-07 DIAGNOSIS — F988 Other specified behavioral and emotional disorders with onset usually occurring in childhood and adolescence: Secondary | ICD-10-CM | POA: Diagnosis present

## 2019-10-21 DIAGNOSIS — E559 Vitamin D deficiency, unspecified: Secondary | ICD-10-CM | POA: Insufficient documentation

## 2020-09-23 LAB — OB RESULTS CONSOLE HIV ANTIBODY (ROUTINE TESTING): HIV: NONREACTIVE

## 2020-09-23 LAB — OB RESULTS CONSOLE HGB/HCT, BLOOD
HCT: 39 (ref 29–41)
Hemoglobin: 12.7

## 2020-09-23 LAB — HEPATITIS C ANTIBODY: HCV Ab: NEGATIVE

## 2020-09-23 LAB — OB RESULTS CONSOLE HEPATITIS B SURFACE ANTIGEN: Hepatitis B Surface Ag: NEGATIVE

## 2020-09-23 LAB — OB RESULTS CONSOLE PLATELET COUNT: Platelets: 297

## 2020-09-23 LAB — OB RESULTS CONSOLE RPR: RPR: NONREACTIVE

## 2020-09-27 LAB — OB RESULTS CONSOLE ABO/RH: RH Type: POSITIVE

## 2020-09-27 LAB — OB RESULTS CONSOLE ANTIBODY SCREEN: Antibody Screen: NEGATIVE

## 2020-09-27 LAB — OB RESULTS CONSOLE RUBELLA ANTIBODY, IGM: Rubella: IMMUNE

## 2021-01-02 ENCOUNTER — Inpatient Hospital Stay (HOSPITAL_COMMUNITY): Payer: 59

## 2021-01-02 ENCOUNTER — Inpatient Hospital Stay (HOSPITAL_COMMUNITY)
Admission: AD | Admit: 2021-01-02 | Discharge: 2021-01-02 | Disposition: A | Discharge status: Home / Self Care | Payer: 59 | Attending: Obstetrics and Gynecology | Admitting: Obstetrics and Gynecology

## 2021-01-02 ENCOUNTER — Encounter (HOSPITAL_COMMUNITY): Payer: Self-pay | Admitting: Obstetrics and Gynecology

## 2021-01-02 ENCOUNTER — Other Ambulatory Visit: Payer: Self-pay

## 2021-01-02 DIAGNOSIS — Z3A24 24 weeks gestation of pregnancy: Secondary | ICD-10-CM | POA: Diagnosis not present

## 2021-01-02 DIAGNOSIS — M545 Low back pain, unspecified: Secondary | ICD-10-CM | POA: Insufficient documentation

## 2021-01-02 DIAGNOSIS — R109 Unspecified abdominal pain: Secondary | ICD-10-CM

## 2021-01-02 DIAGNOSIS — R350 Frequency of micturition: Secondary | ICD-10-CM | POA: Diagnosis not present

## 2021-01-02 DIAGNOSIS — D259 Leiomyoma of uterus, unspecified: Secondary | ICD-10-CM | POA: Diagnosis not present

## 2021-01-02 DIAGNOSIS — O3412 Maternal care for benign tumor of corpus uteri, second trimester: Secondary | ICD-10-CM | POA: Diagnosis not present

## 2021-01-02 DIAGNOSIS — O26892 Other specified pregnancy related conditions, second trimester: Secondary | ICD-10-CM | POA: Diagnosis not present

## 2021-01-02 DIAGNOSIS — R1030 Lower abdominal pain, unspecified: Secondary | ICD-10-CM | POA: Diagnosis not present

## 2021-01-02 HISTORY — DX: Urinary tract infection, site not specified: N39.0

## 2021-01-02 HISTORY — DX: Benign neoplasm of connective and other soft tissue, unspecified: D21.9

## 2021-01-02 LAB — URINALYSIS, ROUTINE W REFLEX MICROSCOPIC
Bilirubin Urine: NEGATIVE
Glucose, UA: NEGATIVE mg/dL
Hgb urine dipstick: NEGATIVE
Ketones, ur: NEGATIVE mg/dL
Nitrite: NEGATIVE
Protein, ur: NEGATIVE mg/dL
Specific Gravity, Urine: 1.017 (ref 1.005–1.030)
pH: 6 (ref 5.0–8.0)

## 2021-01-02 MED ORDER — HYDROMORPHONE HCL 2 MG PO TABS: 2.0000 mg | ORAL_TABLET | ORAL | 0 refills | Status: AC | PRN

## 2021-01-02 MED ORDER — IBUPROFEN 800 MG PO TABS: 800.0000 mg | ORAL_TABLET | Freq: Four times a day (QID) | ORAL | 0 refills | Status: AC

## 2021-01-02 MED ORDER — IBUPROFEN 800 MG PO TABS
800.0000 mg | ORAL_TABLET | Freq: Once | ORAL | Status: AC
  Administered 2021-01-02: 800 mg via ORAL
  Filled 2021-01-02: qty 1

## 2021-01-02 NOTE — MAU Note (Signed)
Having low back pain and lower abd pain, started 2 days ago.  Feels like intense period cramps.  Does have fibroids.  Feeling pelvic pressure. No relief with Tylenol.  No bleeding or leaking.  Some difficulty releasing urine

## 2021-01-02 NOTE — MAU Provider Note (Addendum)
History     Chief Complaint  Patient presents with   Abdominal Pain   Back Pain    35 yo G5P2022 MBF @ [redacted] wk gestation presents with c/o lower abdominal pain for past two days not responsive to Tylenol. Pt has had low back pain. (+) urinary frequency. Denies dysuria. (+) FM. (-) vaginal bleeding. Hx uterine fibroid OB History     Gravida  5   Para  2   Term  2   Preterm      AB  2   Living  2      SAB  1   IAB  1   Ectopic      Multiple  0   Live Births  2           Past Medical History:  Diagnosis Date   C. difficile diarrhea 02/13/2009   Chlamydia    Clostridium difficile infection 02/13/2009   Fibroid    Hx of pyelonephritis    UTI (urinary tract infection)     Past Surgical History:  Procedure Laterality Date   DILATION AND CURETTAGE OF UTERUS      Family History  Problem Relation Age of Onset   Diabetes Mother    Stroke Mother    Healthy Father    Hypertension Maternal Grandmother    Anesthesia problems Neg Hx    Hypotension Neg Hx    Malignant hyperthermia Neg Hx    Pseudochol deficiency Neg Hx     Social History   Tobacco Use   Smoking status: Never   Smokeless tobacco: Never  Vaping Use   Vaping Use: Never used  Substance Use Topics   Alcohol use: Not Currently    Comment: occasional but not while pregnant   Drug use: No    Allergies:  Allergies  Allergen Reactions   Apple Other (See Comments)    Reaction:  Unknown    Codeine Nausea And Vomiting    Medications Prior to Admission  Medication Sig Dispense Refill Last Dose   Acetaminophen (TYLENOL PO) Take 1,000 mg by mouth.   01/02/2021 at 0005   calcium carbonate (TUMS - DOSED IN MG ELEMENTAL CALCIUM) 500 MG chewable tablet Chew 2 tablets by mouth 2 (two) times daily as needed for indigestion or heartburn.   Past Week   Prenatal Vit-Fe Fumarate-FA (PRENATAL MULTIVITAMIN) TABS tablet Take 1 tablet by mouth daily.    01/01/2021   ibuprofen (ADVIL,MOTRIN) 600 MG tablet  Take 1 tablet (600 mg total) by mouth every 6 (six) hours. 30 tablet 0    naproxen (NAPROSYN) 500 MG tablet Take 1 tablet (500 mg total) by mouth 2 (two) times daily. 30 tablet 0    oxyCODONE-acetaminophen (PERCOCET/ROXICET) 5-325 MG per tablet Take 1-2 tablets by mouth every 4 (four) hours as needed (for pain scale 4-7). 20 tablet 0      Physical Exam   Blood pressure 127/79, pulse 89, temperature 98.2 F (36.8 C), temperature source Oral, resp. rate 18, height 5\' 4"  (1.626 m), weight 117.1 kg, last menstrual period 07/18/2020, SpO2 98 %, unknown if currently breastfeeding.  General appearance: alert, cooperative, and no distress Back: no tenderness to percussion or palpation Lungs: clear to auscultation bilaterally Breasts: Normal to palpation without dominant masses Heart: regular rate and rhythm, S1, S2 normal, no murmur, click, rub or gallop Abdomen:  soft obese. Tender suprapubic ? LUS fibroid tender Pelvic: external genitalia normal and flared ext os/int os closed/long/OOP Extremities: no edema, redness or  tenderness in the calves or thighs Tracing (+) FHR  No ctx ED Course  IMP: Lower abdominal pain ? Degenerating LUS fibroid IUP @ 24 wk Uterine fibroids affecting pregnancy Urinary frequency P) u/a, ucx.  OB sonogram assess for degenerating fibroid  MDM   Marvene Staff, MD 10:55 AM 01/02/2021  Addendum" u/a:  small leuk, 6-10 squam cell( not a clean catch) Ucx pending  Preliminary report 9 cm LUS fibroid and another 3 cm  fibroid noted  IMP: degenerating fibroid causing lower abdominal pain Abdominal pain affecting pregnancy , 2nd trimester Uterine fibroid affecting pregnancy, 2nd trimester Urinary frequency IUP @ 24 wk  P) motrin 800mg  po x 1n now and q8 hrs x total 48 hrs only  Dilaudid  2 mg po q 4hrs  prn severe pain  Ice pack to lower abdomen OOW tomorrow  Increase po fluid intake F/u 1 wk D/c home

## 2021-01-03 LAB — CULTURE, OB URINE
Culture: >=100000 — AB
Special Requests: NORMAL

## 2021-02-13 DIAGNOSIS — Z419 Encounter for procedure for purposes other than remedying health state, unspecified: Secondary | ICD-10-CM | POA: Diagnosis not present

## 2021-02-13 NOTE — L&D Delivery Note (Addendum)
OB/GYN Faculty Practice Delivery Note ? ?Kendra Guerrero is a 36 y.o. [id number] s/p SVD at [redacted]w[redacted]d She was admitted for IOL due to polyhydramnios/AMA.  ? ?ROM: 5h 236mith clear fluid ?GBS Status: Negative ? ?Delivery Date/Time: 04/18/21 at 2303 ? ?Delivery: Arrived to room and patient was complete with pressure/urge to push. After pushing with the next two contractions, head delivered ROA. No nuchal cord present. Shoulder and body delivered in usual fashion. Infant with spontaneous cry, placed on mother's abdomen, dried and stimulated. Cord clamped x 2 after 1-minute delay and cut by FOB under direct supervision. Cord blood drawn. Placenta delivered spontaneously with gentle cord traction. Fundus firm with massage and Pitocin. Labia, perineum, vagina, and cervix were inspected, and patient was found to have a hemostatic left periurethral abrasion that was not repaired.  ? ?Placenta: Intact, 3VC - Sent to L&D ?Complications: None ?Lacerations: Left periurethral ?EBL: 250 mL ?Analgesia: Epidural ? ?Infant: Viable female  APGARs 9 and 9  Weight pending ? ?SaKarsten FellsDO PGY-1 ?04/18/2021, 11:24 PM ? ?GME ATTESTATION:  ?I saw and evaluated the patient. I present and gloved for the entire delivery and management of the patient. I agree with the findings and the plan of care as documented in the resident?s note. I have made changes to documentation as necessary. ? ?ChVilma MeckelMD ?OB Fellow, Faculty Practice ?[facility] ?04/18/2021 11:51 PM ? ?

## 2021-02-23 ENCOUNTER — Telehealth: Payer: Self-pay | Admitting: Family Medicine

## 2021-02-23 NOTE — Telephone Encounter (Signed)
Wanting to get an Ultra Sound scheduled

## 2021-02-23 NOTE — Telephone Encounter (Signed)
Called pt and pt requested to have an U/S because she will be 32 weeks on Sunday and had to transfer care to [place] as her private OB does not take her insurance.  Pt states that she was left with needing another U/S because she has a fibroid that is the size of her baby and it could be effecting the baby's growth.  I informed pt that the provider will have to review her records at her NEW OB visit to determine her need for an U/S.  I advised pt that we can move up her OB appt to 02/28/21 at which she then be evaluated.  Pt verbalized understanding.   Frances Nickels  02/23/21

## 2021-02-28 ENCOUNTER — Encounter: Payer: Self-pay | Admitting: Obstetrics and Gynecology

## 2021-02-28 ENCOUNTER — Other Ambulatory Visit: Payer: Self-pay

## 2021-02-28 ENCOUNTER — Ambulatory Visit (INDEPENDENT_AMBULATORY_CARE_PROVIDER_SITE_OTHER): Payer: Medicaid Other | Admitting: Obstetrics and Gynecology

## 2021-02-28 ENCOUNTER — Other Ambulatory Visit (HOSPITAL_COMMUNITY)
Admission: RE | Admit: 2021-02-28 | Discharge: 2021-02-28 | Disposition: A | Discharge status: Home / Self Care | Payer: Medicaid Other | Source: Ambulatory Visit | Attending: Family Medicine | Admitting: Family Medicine

## 2021-02-28 VITALS — BP 123/71 | HR 89 | Wt 261.5 lb

## 2021-02-28 DIAGNOSIS — N898 Other specified noninflammatory disorders of vagina: Secondary | ICD-10-CM | POA: Insufficient documentation

## 2021-02-28 DIAGNOSIS — O3410 Maternal care for benign tumor of corpus uteri, unspecified trimester: Secondary | ICD-10-CM

## 2021-02-28 DIAGNOSIS — Z6841 Body Mass Index (BMI) 40.0 and over, adult: Secondary | ICD-10-CM | POA: Insufficient documentation

## 2021-02-28 DIAGNOSIS — O99210 Obesity complicating pregnancy, unspecified trimester: Secondary | ICD-10-CM

## 2021-02-28 DIAGNOSIS — O09529 Supervision of elderly multigravida, unspecified trimester: Secondary | ICD-10-CM | POA: Insufficient documentation

## 2021-02-28 DIAGNOSIS — D259 Leiomyoma of uterus, unspecified: Secondary | ICD-10-CM

## 2021-02-28 DIAGNOSIS — O0990 Supervision of high risk pregnancy, unspecified, unspecified trimester: Secondary | ICD-10-CM

## 2021-02-28 DIAGNOSIS — O09523 Supervision of elderly multigravida, third trimester: Secondary | ICD-10-CM

## 2021-02-28 DIAGNOSIS — R87612 Low grade squamous intraepithelial lesion on cytologic smear of cervix (LGSIL): Secondary | ICD-10-CM

## 2021-02-28 NOTE — Addendum Note (Signed)
Addended by: Aletha Halim on: 02/28/2021 03:23 PM   Modules accepted: Orders

## 2021-02-28 NOTE — Progress Notes (Signed)
° °  PRENATAL VISIT NOTE  Transfer of care Dr. Garwin Brothers  Subjective:  Kendra Guerrero is a 36 y.o. [id number] at [redacted]w[redacted]d being seen today for ongoing prenatal care.  She is currently monitored for the following issues for this high-risk pregnancy and has Supervision of high risk pregnancy, antepartum; AMA (advanced maternal age) multigravida 35+; BMI 40.0-44.9, adult ([place]); Obesity in pregnancy; and Uterine fibroid complicating antenatal care, baby not yet delivered on their problem list.  Patient reports vag d/c.  Contractions: Irritability. Vag. Bleeding: None.  Movement: Present. Denies leaking of fluid.   The following portions of the patient's history were reviewed and updated as appropriate: allergies, current medications, past family history, past medical history, past social history, past surgical history and problem list.   Objective:   Vitals:   02/28/21 1428  BP: 123/71  Pulse: 89  Weight: 261 lb 8 oz (118.6 kg)    Fetal Status: Fetal Heart Rate (bpm): 140 Fundal Height: 33 cm Movement: Present     General:  Alert, oriented and cooperative. Patient is in no acute distress.  Skin: Skin is warm and dry. No rash noted.   Cardiovascular: Normal heart rate noted  Respiratory: Normal respiratory effort, no problems with respiration noted  Abdomen: Soft, gravid, appropriate for gestational age.  Pain/Pressure: Present     Pelvic: Cervical exam deferred        Extremities: Normal range of motion.  Edema: Trace  Mental Status: Normal mood and affect. Normal behavior. Normal judgment and thought content.   Assessment and Plan:  Pregnancy: A2N0539 at [redacted]w[redacted]d 1. Supervision of high risk pregnancy, antepartum Self swab today. Records reviewed. Inpatient nexplanon - Glucose Tolerance, 1 Hour - CBC - HIV antibody (with reflex) - RPR - [place] MFM OB DETAIL +14 WK; Future  2. Vaginal discharge - Cervicovaginal ancillary only( [place])  3. Multigravida of advanced maternal age in third  trimester - [place] MFM OB DETAIL +14 WK; Future  4. BMI 40.0-44.9, adult ([place]) - [place] MFM OB DETAIL +14 WK; Future  5. Obesity in pregnancy - [place] MFM OB DETAIL +14 WK; Future  6. Uterine fibroid complicating antenatal care, baby not yet delivered D/w her that likely won't be an issue but will get surveillance u/s for follow up - [place] MFM OB DETAIL +14 WK; Future  7. LSIL pap Repeat one year (09/2021)  Preterm labor symptoms and general obstetric precautions including but not limited to vaginal bleeding, contractions, leaking of fluid and fetal movement were reviewed in detail with the patient. Please refer to After Visit Summary for other counseling recommendations.   Return in about 2 weeks (around 03/14/2021) for in person, md or app, low risk ob.  No future appointments.  Aletha Halim, MD

## 2021-03-01 LAB — CERVICOVAGINAL ANCILLARY ONLY
Bacterial Vaginitis (gardnerella): NEGATIVE
Candida Glabrata: NEGATIVE
Candida Vaginitis: NEGATIVE
Chlamydia: NEGATIVE
Comment: NEGATIVE
Comment: NEGATIVE
Comment: NEGATIVE
Comment: NEGATIVE
Comment: NEGATIVE
Comment: NORMAL
Neisseria Gonorrhea: NEGATIVE
Trichomonas: NEGATIVE

## 2021-03-01 LAB — CBC
Hematocrit: 34 % (ref 34.0–46.6)
Hemoglobin: 11.2 g/dL (ref 11.1–15.9)
MCH: 25.8 pg — ABNORMAL LOW (ref 26.6–33.0)
MCHC: 32.9 g/dL (ref 31.5–35.7)
MCV: 78 fL — ABNORMAL LOW (ref 79–97)
Platelets: 317 10*3/uL (ref 150–450)
RBC: 4.34 x10E6/uL (ref 3.77–5.28)
RDW: 12.9 % (ref 11.7–15.4)
WBC: 7 10*3/uL (ref 3.4–10.8)

## 2021-03-01 LAB — RPR: RPR Ser Ql: NONREACTIVE

## 2021-03-01 LAB — HEPATITIS B SURFACE ANTIGEN: Hepatitis B Surface Ag: NEGATIVE

## 2021-03-01 LAB — HIV ANTIBODY (ROUTINE TESTING W REFLEX): HIV Screen 4th Generation wRfx: NONREACTIVE

## 2021-03-01 LAB — GLUCOSE TOLERANCE, 1 HOUR: Glucose, 1Hr PP: 120 mg/dL (ref 70–199)

## 2021-03-08 ENCOUNTER — Ambulatory Visit: Payer: Medicaid Other | Attending: Obstetrics and Gynecology

## 2021-03-08 ENCOUNTER — Ambulatory Visit: Payer: Medicaid Other | Admitting: *Deleted

## 2021-03-08 ENCOUNTER — Ambulatory Visit (HOSPITAL_BASED_OUTPATIENT_CLINIC_OR_DEPARTMENT_OTHER): Payer: Medicaid Other | Admitting: Obstetrics and Gynecology

## 2021-03-08 ENCOUNTER — Other Ambulatory Visit: Payer: Self-pay

## 2021-03-08 VITALS — BP 126/64 | HR 80

## 2021-03-08 DIAGNOSIS — O403XX0 Polyhydramnios, third trimester, not applicable or unspecified: Secondary | ICD-10-CM

## 2021-03-08 DIAGNOSIS — O3413 Maternal care for benign tumor of corpus uteri, third trimester: Secondary | ICD-10-CM | POA: Diagnosis not present

## 2021-03-08 DIAGNOSIS — O99210 Obesity complicating pregnancy, unspecified trimester: Secondary | ICD-10-CM | POA: Diagnosis not present

## 2021-03-08 DIAGNOSIS — O3410 Maternal care for benign tumor of corpus uteri, unspecified trimester: Secondary | ICD-10-CM | POA: Insufficient documentation

## 2021-03-08 DIAGNOSIS — Z3A33 33 weeks gestation of pregnancy: Secondary | ICD-10-CM | POA: Insufficient documentation

## 2021-03-08 DIAGNOSIS — D259 Leiomyoma of uterus, unspecified: Secondary | ICD-10-CM | POA: Insufficient documentation

## 2021-03-08 DIAGNOSIS — Z6841 Body Mass Index (BMI) 40.0 and over, adult: Secondary | ICD-10-CM | POA: Diagnosis not present

## 2021-03-08 DIAGNOSIS — O09523 Supervision of elderly multigravida, third trimester: Secondary | ICD-10-CM | POA: Insufficient documentation

## 2021-03-08 DIAGNOSIS — O99213 Obesity complicating pregnancy, third trimester: Secondary | ICD-10-CM | POA: Diagnosis not present

## 2021-03-08 DIAGNOSIS — O0990 Supervision of high risk pregnancy, unspecified, unspecified trimester: Secondary | ICD-10-CM | POA: Diagnosis not present

## 2021-03-08 NOTE — Progress Notes (Signed)
Maternal-Fetal Medicine   Name: Kendra Guerrero DOB: [birth year] MRN: [id number] Referring Provider: Aletha Halim, MD  I had the pleasure of seeing [name] today at the [facility]. She is G5 P2022 at 33w 2d gestation and is here for ultrasound.  [facility]  She had initiated her care with Dr. Garwin Brothers.  Her prenatal course has been uneventful.  She does not have gestational diabetes.   Advanced maternal age.  On cell free fetal DNA screening, the risks of fetal aneuploidies are not increased.  MSAFP screening showed low risk for open neural tube defects.  Obstetric history significant for 2 term vaginal deliveries.  This pregnancy is from a different partner.  Ultrasound Mild polyhydramnios is seen.  Good fetal activity is present.  Fetal growth is appropriate for gestational age.  Fetal anatomical survey appears normal but limited by advanced gestational age. 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 Patient has slight tenderness on the abdomen overlying the largest myoma.  I counseled the patient on the following: Mild polyhydramnios I explained the finding of polyhydramnios.  In the absence of gestational diabetes and anomalies, it is usually idiopathic (no known cause) and is usually associated with good pregnancy outcomes.  Mild polyhydramnios can lead to preterm labor in some cases.  Myoma I counseled the patient that myomas tend to increase in pregnancy from estrogenic effects.  The likelihood of having severe abdominal pain from Red degeneration is low at this gestation.  It is unlikely to cause obstruction because of its location.  Myomas can lead to preterm delivery.  Maternal obesity (prepregnancy BMI>40) is associated with a small increase in risk of stillbirth, and we therefore recommend weekly BPP till delivery.  Recommendation -BPP in 2 weeks and then weekly BPP till  delivery. -Fetal growth assessment in 4 weeks.  Thank you for consultation.  If you have any questions or concerns, please contact me the [facility].  Consultation including face-to-face (more than 50%) counseling 30 minutes.

## 2021-03-09 ENCOUNTER — Other Ambulatory Visit: Payer: Self-pay | Admitting: *Deleted

## 2021-03-09 DIAGNOSIS — O09523 Supervision of elderly multigravida, third trimester: Secondary | ICD-10-CM

## 2021-03-09 DIAGNOSIS — Z6841 Body Mass Index (BMI) 40.0 and over, adult: Secondary | ICD-10-CM

## 2021-03-09 DIAGNOSIS — D259 Leiomyoma of uterus, unspecified: Secondary | ICD-10-CM

## 2021-03-09 DIAGNOSIS — O409XX0 Polyhydramnios, unspecified trimester, not applicable or unspecified: Secondary | ICD-10-CM

## 2021-03-10 ENCOUNTER — Encounter: Payer: Self-pay | Admitting: Obstetrics and Gynecology

## 2021-03-10 DIAGNOSIS — O409XX0 Polyhydramnios, unspecified trimester, not applicable or unspecified: Secondary | ICD-10-CM | POA: Insufficient documentation

## 2021-03-11 ENCOUNTER — Encounter: Payer: 59 | Admitting: Family Medicine

## 2021-03-14 ENCOUNTER — Other Ambulatory Visit: Payer: Self-pay

## 2021-03-14 ENCOUNTER — Ambulatory Visit (INDEPENDENT_AMBULATORY_CARE_PROVIDER_SITE_OTHER): Payer: Medicaid Other | Admitting: Family Medicine

## 2021-03-14 ENCOUNTER — Encounter: Payer: Self-pay | Admitting: Family Medicine

## 2021-03-14 VITALS — BP 107/72 | HR 106 | Wt 263.9 lb

## 2021-03-14 DIAGNOSIS — O409XX0 Polyhydramnios, unspecified trimester, not applicable or unspecified: Secondary | ICD-10-CM

## 2021-03-14 DIAGNOSIS — R030 Elevated blood-pressure reading, without diagnosis of hypertension: Secondary | ICD-10-CM

## 2021-03-14 DIAGNOSIS — O09529 Supervision of elderly multigravida, unspecified trimester: Secondary | ICD-10-CM

## 2021-03-14 DIAGNOSIS — D259 Leiomyoma of uterus, unspecified: Secondary | ICD-10-CM

## 2021-03-14 DIAGNOSIS — O3410 Maternal care for benign tumor of corpus uteri, unspecified trimester: Secondary | ICD-10-CM

## 2021-03-14 DIAGNOSIS — Z3009 Encounter for other general counseling and advice on contraception: Secondary | ICD-10-CM

## 2021-03-14 DIAGNOSIS — Z3A34 34 weeks gestation of pregnancy: Secondary | ICD-10-CM

## 2021-03-14 DIAGNOSIS — O0990 Supervision of high risk pregnancy, unspecified, unspecified trimester: Secondary | ICD-10-CM

## 2021-03-14 NOTE — Progress Notes (Signed)
Patient in for routine prenatal visit. States that at last [place] appt she was told she had mild polyhydramnios. Would like to discuss with provider this and what she can expect with this.  Reports good fetal movement, no other concerns at this time.  Altha Harm, CMA

## 2021-03-14 NOTE — Progress Notes (Signed)
° °  PRENATAL VISIT NOTE  Subjective:  Kendra Guerrero is a 36 y.o. [id number] at 108w1d being seen today for ongoing prenatal care.  She is currently monitored for the following issues for this high-risk pregnancy and has Supervision of high risk pregnancy, antepartum; AMA (advanced maternal age) multigravida 35+; BMI 40.0-44.9, adult ([place]); Obesity in pregnancy; Uterine fibroid complicating antenatal care, baby not yet delivered; LGSIL of cervix of undetermined significance; Polyhydramnios affecting pregnancy; and Unwanted fertility on their problem list.  Patient reports no complaints.  Contractions: Irritability. Vag. Bleeding: None.  Movement: Present. Denies leaking of fluid.   The following portions of the patient's history were reviewed and updated as appropriate: allergies, current medications, past family history, past medical history, past social history, past surgical history and problem list.   Objective:   Vitals:   03/14/21 1421 03/14/21 1508  BP: (!) 144/74 107/72  Pulse: (!) 107 (!) 106  Weight: 263 lb 14.4 oz (119.7 kg)     Fetal Status: Fetal Heart Rate (bpm): 152 Fundal Height: 34 cm Movement: Present  General:  Alert, oriented and cooperative. Patient is in no acute distress.  Skin: Skin is warm and dry. No rash noted.   Cardiovascular: Normal heart rate noted.  Respiratory: Normal respiratory effort, no problems with respiration noted.  Abdomen: Soft, gravid, appropriate for gestational age.       Pelvic: Cervical exam deferred.  Extremities: Normal range of motion.  Edema: Trace.  Mental Status: Normal mood and affect. Normal behavior. Normal judgment and thought content.   Assessment and Plan:  Pregnancy: D3T7017 at [redacted]w[redacted]d  1. Supervision of high risk pregnancy, antepartum 2. [redacted] weeks gestation of pregnancy Progressing well. FH and FHT within normal limits. Follow up for next OB visit in 2 weeks.   3. Polyhydramnios affecting pregnancy Noted on [place] on 1/24.  Mild. Antenatal testing scheduled with MFM. Follow up growth in 4 weeks.   4. Antepartum multigravida of advanced maternal age Genetic testing normal.   5. Uterine fibroid complicating antenatal care, baby not yet delivered Three intramural fibroids noted on last [place]. Not suspected to interfere with delivery per MFM. Will have follow up growth in 4 weeks.   6. Elevated BP without diagnosis of hypertension Initial BP elevated, normal on repeat. Attributed to being anxious. No symptoms of pre-E. Will monitor for now. Strict return precautions reviewed with patient should she develop signs/symptoms of pre-eclampsia and patient voiced understanding.   7. Unwanted fertility Patient would like BTL for contraception postpartum. Risks/benefits of BTL reviewed. Consent signed today 03/14/21.   Preterm labor symptoms and general obstetric precautions including but not limited to vaginal bleeding, contractions, leaking of fluid and fetal movement were reviewed in detail with the patient.  Please refer to After Visit Summary for other counseling recommendations.   Return in about 2 weeks (around 03/28/2021) for follow up HR OB visit.  Future Appointments  Date Time Provider [place]  03/21/2021  3:15 PM [facility] NST [facility]  03/21/2021  4:15 PM Patriciaann Clan, DO [facility]  03/28/2021 10:15 AM [facility] NST [facility]  03/28/2021 11:15 AM Aletha Halim, MD [facility]  04/04/2021  9:30 AM [facility] NURSE [facility]  04/04/2021  9:45 AM [facility] US6 [facility]  04/04/2021 10:55 AM Virginia Rochester, NP [facility]  04/11/2021  9:15 AM [facility] NST [facility]    Genia Del, MD

## 2021-03-16 DIAGNOSIS — Z419 Encounter for procedure for purposes other than remedying health state, unspecified: Secondary | ICD-10-CM | POA: Diagnosis not present

## 2021-03-21 ENCOUNTER — Ambulatory Visit (INDEPENDENT_AMBULATORY_CARE_PROVIDER_SITE_OTHER): Payer: Medicaid Other | Admitting: Family Medicine

## 2021-03-21 ENCOUNTER — Ambulatory Visit (INDEPENDENT_AMBULATORY_CARE_PROVIDER_SITE_OTHER): Payer: Medicaid Other

## 2021-03-21 ENCOUNTER — Telehealth: Payer: Self-pay

## 2021-03-21 ENCOUNTER — Other Ambulatory Visit: Payer: Self-pay

## 2021-03-21 ENCOUNTER — Ambulatory Visit (INDEPENDENT_AMBULATORY_CARE_PROVIDER_SITE_OTHER): Payer: Medicaid Other | Admitting: General Practice

## 2021-03-21 ENCOUNTER — Encounter: Payer: Self-pay | Admitting: Family Medicine

## 2021-03-21 VITALS — BP 118/76 | HR 99 | Wt 262.0 lb

## 2021-03-21 DIAGNOSIS — Z23 Encounter for immunization: Secondary | ICD-10-CM

## 2021-03-21 DIAGNOSIS — O0990 Supervision of high risk pregnancy, unspecified, unspecified trimester: Secondary | ICD-10-CM | POA: Diagnosis not present

## 2021-03-21 DIAGNOSIS — O409XX0 Polyhydramnios, unspecified trimester, not applicable or unspecified: Secondary | ICD-10-CM

## 2021-03-21 DIAGNOSIS — Z6841 Body Mass Index (BMI) 40.0 and over, adult: Secondary | ICD-10-CM

## 2021-03-21 DIAGNOSIS — Z3A35 35 weeks gestation of pregnancy: Secondary | ICD-10-CM

## 2021-03-21 NOTE — Patient Instructions (Signed)
[email or web address]

## 2021-03-21 NOTE — Progress Notes (Signed)
Pt informed that the ultrasound is considered a limited OB ultrasound and is not intended to be a complete ultrasound exam.  Patient also informed that the ultrasound is not being completed with the intent of assessing for fetal or placental anomalies or any pelvic abnormalities.  Explained that the purpose of todays ultrasound is to assess for  BPP, presentation, and AFI.  Patient acknowledges the purpose of the exam and the limitations of the study.     Koren Bound RN BSN 03/21/21

## 2021-03-21 NOTE — Telephone Encounter (Signed)
Called pt to offer earlier appt today at 1:15 for NST/BPP. VM left stating I am calling to change appt time if possible. MyChart message changed.

## 2021-03-21 NOTE — Progress Notes (Signed)
° ° °  Subjective:  CLYDE UPSHAW is a 36 y.o. [id number] at [redacted]w[redacted]d being seen today for ongoing prenatal care.  She is currently monitored for the following issues for this high-risk pregnancy and has Supervision of high risk pregnancy, antepartum; AMA (advanced maternal age) multigravida 35+; BMI 40.0-44.9, adult ([place]); Obesity in pregnancy; Uterine fibroid complicating antenatal care, baby not yet delivered; LGSIL of cervix of undetermined significance; Polyhydramnios affecting pregnancy; and Unwanted fertility on their problem list.  Patient reports no complaints.  Just had BPP/NST that went well. Contractions: Irritability. Vag. Bleeding: None.  Movement: Present. Denies leaking of fluid.   The following portions of the patient's history were reviewed and updated as appropriate: allergies, current medications, past family history, past medical history, past social history, past surgical history and problem list.   Objective:   Vitals:   03/21/21 1529  BP: 118/76  Pulse: 99  Weight: 262 lb (118.8 kg)    Fetal Status: Fetal Heart Rate (bpm): NST   Movement: Present     General:  Alert, oriented and cooperative. Patient is in no acute distress.  Skin: Skin is warm and dry. No rash noted.   Cardiovascular: Normal heart rate noted  Respiratory: Normal respiratory effort, no problems with respiration noted  Abdomen: Soft, gravid, appropriate for gestational age. Pain/Pressure: Present     Pelvic:  Cervical exam deferred        Extremities: Normal range of motion.  Edema: None  Mental Status: Normal mood and affect. Normal behavior. Normal judgment and thought content.   Assessment and Plan:  Pregnancy: [id number] at [redacted]w[redacted]d  1. Supervision of high risk pregnancy, antepartum Doing well, normal fetal movement.  - Tdap vaccine greater than or equal to 7yo IM  2. [redacted] weeks gestation of pregnancy GBS/gc/ch swabs during next visit.   3. Polyhydramnios affecting pregnancy AFI 25 on recent US,  without other abnormalities seen. Growth f/u on 2/20.   4. BMI 40.0-44.9, adult ([place]) Growth [place] as above. Cont weekly BPP.    Preterm labor symptoms and general obstetric precautions including but not limited to vaginal bleeding, contractions, leaking of fluid and fetal movement were reviewed in detail with the patient. Please refer to After Visit Summary for other counseling recommendations.   Return in about 1 week (around 03/28/2021) for HROB with GBS.   Patriciaann Clan, DO

## 2021-03-23 ENCOUNTER — Other Ambulatory Visit: Payer: Medicaid Other

## 2021-03-28 ENCOUNTER — Other Ambulatory Visit: Payer: Self-pay

## 2021-03-28 ENCOUNTER — Other Ambulatory Visit: Payer: Medicaid Other

## 2021-03-28 ENCOUNTER — Other Ambulatory Visit (HOSPITAL_COMMUNITY)
Admission: RE | Admit: 2021-03-28 | Discharge: 2021-03-28 | Disposition: A | Discharge status: Home / Self Care | Payer: Medicaid Other | Source: Ambulatory Visit | Attending: Obstetrics and Gynecology | Admitting: Obstetrics and Gynecology

## 2021-03-28 ENCOUNTER — Ambulatory Visit: Payer: Medicaid Other | Admitting: General Practice

## 2021-03-28 ENCOUNTER — Ambulatory Visit (INDEPENDENT_AMBULATORY_CARE_PROVIDER_SITE_OTHER): Payer: Medicaid Other | Admitting: Obstetrics and Gynecology

## 2021-03-28 ENCOUNTER — Ambulatory Visit (INDEPENDENT_AMBULATORY_CARE_PROVIDER_SITE_OTHER): Payer: Medicaid Other

## 2021-03-28 VITALS — BP 119/71 | HR 112 | Wt 265.0 lb

## 2021-03-28 DIAGNOSIS — Z3A36 36 weeks gestation of pregnancy: Secondary | ICD-10-CM | POA: Diagnosis not present

## 2021-03-28 DIAGNOSIS — O0990 Supervision of high risk pregnancy, unspecified, unspecified trimester: Secondary | ICD-10-CM | POA: Insufficient documentation

## 2021-03-28 DIAGNOSIS — Z6841 Body Mass Index (BMI) 40.0 and over, adult: Secondary | ICD-10-CM

## 2021-03-28 DIAGNOSIS — O409XX0 Polyhydramnios, unspecified trimester, not applicable or unspecified: Secondary | ICD-10-CM

## 2021-03-28 DIAGNOSIS — O99210 Obesity complicating pregnancy, unspecified trimester: Secondary | ICD-10-CM

## 2021-03-28 DIAGNOSIS — D259 Leiomyoma of uterus, unspecified: Secondary | ICD-10-CM

## 2021-03-28 DIAGNOSIS — O3410 Maternal care for benign tumor of corpus uteri, unspecified trimester: Secondary | ICD-10-CM

## 2021-03-28 LAB — FETAL NONSTRESS TEST

## 2021-03-28 LAB — OB RESULTS CONSOLE GC/CHLAMYDIA: Gonorrhea: NEGATIVE

## 2021-03-28 NOTE — Progress Notes (Signed)
Pt informed that the ultrasound is considered a limited OB ultrasound and is not intended to be a complete ultrasound exam.  Patient also informed that the ultrasound is not being completed with the intent of assessing for fetal or placental anomalies or any pelvic abnormalities.  Explained that the purpose of todays ultrasound is to assess for  BPP, presentation, and AFI.  Patient acknowledges the purpose of the exam and the limitations of the study.     Koren Bound RN BSN 03/28/21

## 2021-03-29 LAB — GC/CHLAMYDIA PROBE AMP (~~LOC~~) NOT AT ARMC
Chlamydia: NEGATIVE
Comment: NEGATIVE
Comment: NORMAL
Neisseria Gonorrhea: NEGATIVE

## 2021-03-30 NOTE — Progress Notes (Signed)
° °  PRENATAL VISIT NOTE  Subjective:  Kendra Guerrero is a 36 y.o. [id number] at [redacted]w[redacted]d being seen today for ongoing prenatal care.  She is currently monitored for the following issues for this high-risk pregnancy and has Supervision of high risk pregnancy, antepartum; AMA (advanced maternal age) multigravida 35+; BMI 40.0-44.9, adult ([place]); Obesity in pregnancy; Uterine fibroid complicating antenatal care, baby not yet delivered; LGSIL of cervix of undetermined significance; Polyhydramnios affecting pregnancy; and Unwanted fertility on their problem list.  Patient reports no complaints.  Contractions: Irritability. Vag. Bleeding: None.  Movement: Present. Denies leaking of fluid.   The following portions of the patient's history were reviewed and updated as appropriate: allergies, current medications, past family history, past medical history, past social history, past surgical history and problem list.   Objective:   Vitals:   03/28/21 1045  BP: 119/71  Pulse: (!) 112  Weight: 265 lb (120.2 kg)    Fetal Status: Fetal Heart Rate (bpm): NST   Movement: Present     General:  Alert, oriented and cooperative. Patient is in no acute distress.  Skin: Skin is warm and dry. No rash noted.   Cardiovascular: Normal heart rate noted  Respiratory: Normal respiratory effort, no problems with respiration noted  Abdomen: Soft, gravid, appropriate for gestational age.  Pain/Pressure: Present     Pelvic: Cervical exam deferred        Extremities: Normal range of motion.  Edema: None  Mental Status: Normal mood and affect. Normal behavior. Normal judgment and thought content.   Assessment and Plan:  Pregnancy: [id number] at [redacted]w[redacted]d 1. Supervision of high risk pregnancy, antepartum BTL papers signed on 1/30 Consider setting up 39-40wk IOL next visit 1/24: 51%, 2233gm, ac 76%>f/u rpt on 2/20 - Culture, beta strep (group b only) - GC/Chlamydia probe amp ([place])not at [facility]  2. Uterine fibroid  complicating antenatal care, baby not yet delivered stable  3. Polyhydramnios affecting pregnancy Follow up bpp from today. Afi normal last week  4. Obesity in pregnancy Continue qwk testing.   5. BMI 40.0-44.9, adult ([place])  Preterm labor symptoms and general obstetric precautions including but not limited to vaginal bleeding, contractions, leaking of fluid and fetal movement were reviewed in detail with the patient. Please refer to After Visit Summary for other counseling recommendations.   No follow-ups on file.  Future Appointments  Date Time Provider [place]  04/04/2021  9:30 AM [facility] NURSE [facility]  04/04/2021  9:45 AM [facility] US6 [facility]  04/04/2021 10:55 AM Virginia Rochester, NP [facility]  04/11/2021  9:15 AM [facility] NST [facility]    Aletha Halim, MD

## 2021-04-01 LAB — CULTURE, BETA STREP (GROUP B ONLY): Strep Gp B Culture: NEGATIVE

## 2021-04-04 ENCOUNTER — Ambulatory Visit: Payer: Medicaid Other | Attending: Obstetrics and Gynecology

## 2021-04-04 ENCOUNTER — Other Ambulatory Visit: Payer: Self-pay

## 2021-04-04 ENCOUNTER — Ambulatory Visit (INDEPENDENT_AMBULATORY_CARE_PROVIDER_SITE_OTHER): Payer: Medicaid Other | Admitting: Nurse Practitioner

## 2021-04-04 ENCOUNTER — Other Ambulatory Visit: Payer: Medicaid Other

## 2021-04-04 ENCOUNTER — Ambulatory Visit: Payer: Medicaid Other | Admitting: *Deleted

## 2021-04-04 ENCOUNTER — Encounter: Payer: Self-pay | Admitting: *Deleted

## 2021-04-04 VITALS — BP 124/72 | HR 87

## 2021-04-04 VITALS — BP 125/83 | HR 82 | Wt 268.3 lb

## 2021-04-04 DIAGNOSIS — Z3A37 37 weeks gestation of pregnancy: Secondary | ICD-10-CM | POA: Diagnosis not present

## 2021-04-04 DIAGNOSIS — O0990 Supervision of high risk pregnancy, unspecified, unspecified trimester: Secondary | ICD-10-CM

## 2021-04-04 DIAGNOSIS — O3410 Maternal care for benign tumor of corpus uteri, unspecified trimester: Secondary | ICD-10-CM

## 2021-04-04 DIAGNOSIS — O409XX0 Polyhydramnios, unspecified trimester, not applicable or unspecified: Secondary | ICD-10-CM | POA: Diagnosis not present

## 2021-04-04 DIAGNOSIS — O09523 Supervision of elderly multigravida, third trimester: Secondary | ICD-10-CM | POA: Diagnosis not present

## 2021-04-04 DIAGNOSIS — Z3009 Encounter for other general counseling and advice on contraception: Secondary | ICD-10-CM

## 2021-04-04 DIAGNOSIS — D259 Leiomyoma of uterus, unspecified: Secondary | ICD-10-CM

## 2021-04-04 DIAGNOSIS — O99210 Obesity complicating pregnancy, unspecified trimester: Secondary | ICD-10-CM

## 2021-04-04 DIAGNOSIS — Z6841 Body Mass Index (BMI) 40.0 and over, adult: Secondary | ICD-10-CM | POA: Diagnosis not present

## 2021-04-04 DIAGNOSIS — O3413 Maternal care for benign tumor of corpus uteri, third trimester: Secondary | ICD-10-CM | POA: Insufficient documentation

## 2021-04-04 NOTE — Progress Notes (Signed)
° ° °  Subjective:  Kendra Guerrero is a 36 y.o. T1X7262 at [redacted]w[redacted]d being seen today for ongoing prenatal care.  She is currently monitored for the following issues for this high-risk pregnancy and has Supervision of high risk pregnancy, antepartum; AMA (advanced maternal age) multigravida 35+; BMI 40.0-44.9, adult ([place]); Obesity in pregnancy; Uterine fibroid complicating antenatal care, baby not yet delivered; LGSIL of cervix of undetermined significance; Polyhydramnios affecting pregnancy; and Unwanted fertility on their problem list.  Patient reports  tired and lightening type pains in crotch .  Contractions: Irritability. Vag. Bleeding: None.  Movement: Present. Denies leaking of fluid.   The following portions of the patient's history were reviewed and updated as appropriate: allergies, current medications, past family history, past medical history, past social history, past surgical history and problem list. Problem list updated.  Objective:   Vitals:   04/04/21 1058  BP: 125/83  Pulse: 82  Weight: 268 lb 4.8 oz (121.7 kg)    Fetal Status: Fetal Heart Rate (bpm): 155   Movement: Present     General:  Alert, oriented and cooperative. Patient is in no acute distress.  Skin: Skin is warm and dry. No rash noted.   Cardiovascular: Normal heart rate noted  Respiratory: Normal respiratory effort, no problems with respiration noted  Abdomen: Soft, gravid, appropriate for gestational age. Pain/Pressure: Absent     Pelvic:  Cervical exam deferred        Extremities: Normal range of motion.  Edema: None  Mental Status: Normal mood and affect. Normal behavior. Normal judgment and thought content.   Urinalysis:      Assessment and Plan:  Pregnancy: M3T5974 at [redacted]w[redacted]d  1. Supervision of high risk pregnancy, antepartum Will arrange for induction at 39-40 weeks per Dr. Ilda Basset' note Note sent to admin staff to schedule provider visit as well as NST that is already scheduled in the  office Induction scheduled for March 6 at [redacted]w[redacted]d  2. Polyhydramnios affecting pregnancy Had BPP at MFM today and is scheduled for NST next week  3. Obesity in pregnancy   4. Uterine fibroid complicating antenatal care, baby not yet delivered   5. Unwanted fertility Will have BTL  6. [redacted] weeks gestation of pregnancy   Term labor symptoms and general obstetric precautions including but not limited to vaginal bleeding, contractions, leaking of fluid and fetal movement were reviewed in detail with the patient. Please refer to After Visit Summary for other counseling recommendations.  Return in about 1 week (around 04/11/2021) for [facility] in person  - NST already scheduled.  Earlie Server, RN, MSN, NP-BC Nurse Practitioner, [facility] for [employer], [facility] 04/04/2021 11:39 AM

## 2021-04-05 ENCOUNTER — Telehealth: Payer: Self-pay | Admitting: Lactation Services

## 2021-04-05 ENCOUNTER — Telehealth (HOSPITAL_COMMUNITY): Payer: Self-pay | Admitting: *Deleted

## 2021-04-05 NOTE — Telephone Encounter (Signed)
-----   Message from Virginia Rochester, NP sent at 04/04/2021 11:35 AM EST ----- Regarding: Wants list of meds Wants list of meds or supplements for increasing breast milk after birth.  Had trouble producing milk last time - now 37 weeks.

## 2021-04-05 NOTE — Telephone Encounter (Signed)
Called patient at request of Earlie Server, np in regards to Lactation concerns.   Patient did not answer. LM for her to call the office at her convenience to speak with Lactation.   Will send My Chart message and LM for patient to check her My Chart message at her convenience.

## 2021-04-05 NOTE — Telephone Encounter (Signed)
Preadmission screen  

## 2021-04-06 ENCOUNTER — Ambulatory Visit: Payer: Medicaid Other

## 2021-04-07 ENCOUNTER — Encounter (HOSPITAL_COMMUNITY): Payer: Self-pay

## 2021-04-07 ENCOUNTER — Telehealth (HOSPITAL_COMMUNITY): Payer: Self-pay | Admitting: *Deleted

## 2021-04-07 ENCOUNTER — Encounter (HOSPITAL_COMMUNITY): Payer: Self-pay | Admitting: *Deleted

## 2021-04-07 NOTE — Telephone Encounter (Signed)
Preadmission screen  

## 2021-04-11 ENCOUNTER — Encounter: Payer: Medicaid Other | Admitting: Obstetrics and Gynecology

## 2021-04-11 ENCOUNTER — Other Ambulatory Visit: Payer: Medicaid Other

## 2021-04-13 ENCOUNTER — Other Ambulatory Visit: Payer: Self-pay | Admitting: Advanced Practice Midwife

## 2021-04-13 DIAGNOSIS — Z419 Encounter for procedure for purposes other than remedying health state, unspecified: Secondary | ICD-10-CM | POA: Diagnosis not present

## 2021-04-15 ENCOUNTER — Encounter (HOSPITAL_COMMUNITY): Payer: Self-pay | Admitting: Obstetrics and Gynecology

## 2021-04-15 ENCOUNTER — Other Ambulatory Visit: Payer: Self-pay | Admitting: Family Medicine

## 2021-04-16 LAB — SARS CORONAVIRUS 2 (TAT 6-24 HRS): SARS Coronavirus 2: NEGATIVE

## 2021-04-18 ENCOUNTER — Other Ambulatory Visit: Payer: Self-pay

## 2021-04-18 ENCOUNTER — Inpatient Hospital Stay (HOSPITAL_COMMUNITY)
Admission: AD | Admit: 2021-04-18 | Discharge: 2021-04-20 | DRG: 807 | Disposition: A | Discharge status: Home / Self Care | Payer: Medicaid Other | Attending: Obstetrics and Gynecology | Admitting: Obstetrics and Gynecology

## 2021-04-18 ENCOUNTER — Inpatient Hospital Stay (HOSPITAL_COMMUNITY): Payer: Medicaid Other

## 2021-04-18 ENCOUNTER — Encounter (HOSPITAL_COMMUNITY): Payer: Medicaid Other

## 2021-04-18 ENCOUNTER — Inpatient Hospital Stay (HOSPITAL_COMMUNITY): Payer: Medicaid Other | Admitting: Anesthesiology

## 2021-04-18 ENCOUNTER — Encounter (HOSPITAL_COMMUNITY): Payer: Self-pay | Admitting: Family Medicine

## 2021-04-18 DIAGNOSIS — Z3A39 39 weeks gestation of pregnancy: Secondary | ICD-10-CM

## 2021-04-18 DIAGNOSIS — O99214 Obesity complicating childbirth: Secondary | ICD-10-CM | POA: Diagnosis present

## 2021-04-18 DIAGNOSIS — O09529 Supervision of elderly multigravida, unspecified trimester: Secondary | ICD-10-CM

## 2021-04-18 DIAGNOSIS — O403XX0 Polyhydramnios, third trimester, not applicable or unspecified: Principal | ICD-10-CM | POA: Diagnosis present

## 2021-04-18 DIAGNOSIS — O3410 Maternal care for benign tumor of corpus uteri, unspecified trimester: Secondary | ICD-10-CM | POA: Diagnosis present

## 2021-04-18 DIAGNOSIS — D649 Anemia, unspecified: Secondary | ICD-10-CM | POA: Diagnosis not present

## 2021-04-18 DIAGNOSIS — O99210 Obesity complicating pregnancy, unspecified trimester: Secondary | ICD-10-CM | POA: Diagnosis present

## 2021-04-18 DIAGNOSIS — O409XX0 Polyhydramnios, unspecified trimester, not applicable or unspecified: Secondary | ICD-10-CM | POA: Diagnosis present

## 2021-04-18 DIAGNOSIS — O3413 Maternal care for benign tumor of corpus uteri, third trimester: Secondary | ICD-10-CM | POA: Diagnosis not present

## 2021-04-18 DIAGNOSIS — O26893 Other specified pregnancy related conditions, third trimester: Secondary | ICD-10-CM | POA: Diagnosis not present

## 2021-04-18 DIAGNOSIS — D259 Leiomyoma of uterus, unspecified: Secondary | ICD-10-CM | POA: Diagnosis not present

## 2021-04-18 DIAGNOSIS — Z3009 Encounter for other general counseling and advice on contraception: Secondary | ICD-10-CM | POA: Diagnosis present

## 2021-04-18 DIAGNOSIS — O9902 Anemia complicating childbirth: Secondary | ICD-10-CM | POA: Diagnosis not present

## 2021-04-18 DIAGNOSIS — O0990 Supervision of high risk pregnancy, unspecified, unspecified trimester: Secondary | ICD-10-CM

## 2021-04-18 HISTORY — DX: Unspecified abnormal cytological findings in specimens from vagina: R87.629

## 2021-04-18 LAB — CBC
HCT: 34.9 % — ABNORMAL LOW (ref 36.0–46.0)
Hemoglobin: 11.1 g/dL — ABNORMAL LOW (ref 12.0–15.0)
MCH: 26.1 pg (ref 26.0–34.0)
MCHC: 31.8 g/dL (ref 30.0–36.0)
MCV: 82.1 fL (ref 80.0–100.0)
Platelets: 285 10*3/uL (ref 150–400)
RBC: 4.25 MIL/uL (ref 3.87–5.11)
RDW: 15 % (ref 11.5–15.5)
WBC: 7.4 10*3/uL (ref 4.0–10.5)
nRBC: 0 % (ref 0.0–0.2)

## 2021-04-18 LAB — TYPE AND SCREEN
ABO/RH(D): A POS
Antibody Screen: NEGATIVE

## 2021-04-18 LAB — RPR: RPR Ser Ql: NONREACTIVE

## 2021-04-18 MED ORDER — EPHEDRINE 5 MG/ML INJ: 10.0000 mg | INTRAVENOUS | Status: DC | PRN

## 2021-04-18 MED ORDER — TERBUTALINE SULFATE 1 MG/ML IJ SOLN: 0.2500 mg | Freq: Once | INTRAMUSCULAR | Status: DC | PRN

## 2021-04-18 MED ORDER — FENTANYL CITRATE (PF) 100 MCG/2ML IJ SOLN
50.0000 ug | INTRAMUSCULAR | Status: DC | PRN
  Administered 2021-04-18: 100 ug via INTRAVENOUS
  Administered 2021-04-18: 50 ug via INTRAVENOUS
  Administered 2021-04-18: 100 ug via INTRAVENOUS
  Administered 2021-04-18 (×2): 50 ug via INTRAVENOUS
  Filled 2021-04-18 (×4): qty 2

## 2021-04-18 MED ORDER — FENTANYL CITRATE (PF) 100 MCG/2ML IJ SOLN: 100.0000 ug | INTRAMUSCULAR | Status: DC | PRN

## 2021-04-18 MED ORDER — PHENYLEPHRINE 40 MCG/ML (10ML) SYRINGE FOR IV PUSH (FOR BLOOD PRESSURE SUPPORT)
80.0000 ug | PREFILLED_SYRINGE | INTRAVENOUS | Status: DC | PRN
  Administered 2021-04-18 (×2): 80 ug via INTRAVENOUS

## 2021-04-18 MED ORDER — OXYTOCIN-SODIUM CHLORIDE 30-0.9 UT/500ML-% IV SOLN
1.0000 m[IU]/min | INTRAVENOUS | Status: DC
  Administered 2021-04-18: 2 m[IU]/min via INTRAVENOUS
  Filled 2021-04-18: qty 500

## 2021-04-18 MED ORDER — LACTATED RINGERS IV SOLN: INTRAVENOUS | Status: DC

## 2021-04-18 MED ORDER — OXYCODONE-ACETAMINOPHEN 5-325 MG PO TABS: 2.0000 | ORAL_TABLET | ORAL | Status: DC | PRN

## 2021-04-18 MED ORDER — OXYTOCIN-SODIUM CHLORIDE 30-0.9 UT/500ML-% IV SOLN: 2.5000 [IU]/h | INTRAVENOUS | Status: DC

## 2021-04-18 MED ORDER — ACETAMINOPHEN 325 MG PO TABS: 650.0000 mg | ORAL_TABLET | ORAL | Status: DC | PRN

## 2021-04-18 MED ORDER — LIDOCAINE HCL (PF) 1 % IJ SOLN
INTRAMUSCULAR | Status: DC | PRN
  Administered 2021-04-18: 5 mL via EPIDURAL

## 2021-04-18 MED ORDER — OXYCODONE-ACETAMINOPHEN 5-325 MG PO TABS: 1.0000 | ORAL_TABLET | ORAL | Status: DC | PRN

## 2021-04-18 MED ORDER — LACTATED RINGERS IV SOLN: 500.0000 mL | INTRAVENOUS | Status: DC | PRN

## 2021-04-18 MED ORDER — MISOPROSTOL 25 MCG QUARTER TABLET: 25.0000 ug | ORAL_TABLET | ORAL | Status: DC | PRN

## 2021-04-18 MED ORDER — LACTATED RINGERS IV SOLN
500.0000 mL | Freq: Once | INTRAVENOUS | Status: AC
  Administered 2021-04-18: 500 mL via INTRAVENOUS

## 2021-04-18 MED ORDER — FLEET ENEMA 7-19 GM/118ML RE ENEM
1.0000 | ENEMA | RECTAL | Status: DC | PRN
Start: 2021-04-18 — End: 2021-04-19

## 2021-04-18 MED ORDER — LIDOCAINE-EPINEPHRINE (PF) 1.5 %-1:200000 IJ SOLN
INTRAMUSCULAR | Status: DC | PRN
  Administered 2021-04-18: 5 mL via EPIDURAL

## 2021-04-18 MED ORDER — PHENYLEPHRINE 40 MCG/ML (10ML) SYRINGE FOR IV PUSH (FOR BLOOD PRESSURE SUPPORT)
80.0000 ug | PREFILLED_SYRINGE | INTRAVENOUS | Status: DC | PRN
  Filled 2021-04-18: qty 10

## 2021-04-18 MED ORDER — ONDANSETRON HCL 4 MG/2ML IJ SOLN
4.0000 mg | Freq: Four times a day (QID) | INTRAMUSCULAR | Status: DC | PRN
  Administered 2021-04-18: 4 mg via INTRAVENOUS
  Filled 2021-04-18: qty 2

## 2021-04-18 MED ORDER — LIDOCAINE HCL (PF) 1 % IJ SOLN: 30.0000 mL | INTRAMUSCULAR | Status: DC | PRN

## 2021-04-18 MED ORDER — SOD CITRATE-CITRIC ACID 500-334 MG/5ML PO SOLN: 30.0000 mL | ORAL | Status: DC | PRN

## 2021-04-18 MED ORDER — DIPHENHYDRAMINE HCL 50 MG/ML IJ SOLN: 12.5000 mg | INTRAMUSCULAR | Status: DC | PRN

## 2021-04-18 MED ORDER — FENTANYL-BUPIVACAINE-NACL 0.5-0.125-0.9 MG/250ML-% EP SOLN
12.0000 mL/h | EPIDURAL | Status: DC | PRN
  Administered 2021-04-18: 12 mL/h via EPIDURAL
  Filled 2021-04-18: qty 250

## 2021-04-18 MED ORDER — OXYTOCIN BOLUS FROM INFUSION
333.0000 mL | Freq: Once | INTRAVENOUS | Status: AC
  Administered 2021-04-18: 333 mL via INTRAVENOUS

## 2021-04-18 NOTE — Anesthesia Procedure Notes (Signed)
Epidural ?Patient location during procedure: OB ?Start time: 04/18/2021 4:58 PM ?End time: 04/18/2021 5:08 PM ? ?Staffing ?Anesthesiologist: Murvin Natal, MD ?Performed: anesthesiologist  ? ?Preanesthetic Checklist ?Completed: patient identified, IV checked, site marked, risks and benefits discussed, monitors and equipment checked, pre-op evaluation and timeout performed ? ?Epidural ?Patient position: sitting ?Prep: DuraPrep ?Patient monitoring: heart rate, cardiac monitor, continuous pulse ox and blood pressure ?Approach: midline ?Location: L4-L5 ?Injection technique: LOR air ? ?Needle:  ?Needle type: Tuohy  ?Needle gauge: 17 G ?Needle length: 9 cm ?Needle insertion depth: 8 cm ?Catheter type: closed end flexible ?Catheter size: 19 Gauge ?Catheter at skin depth: 14 cm ?Test dose: negative and 1.5% lidocaine with Epi 1:200 K ? ?Assessment ?Events: blood not aspirated, injection not painful, no injection resistance and negative IV test ? ?Additional Notes ?Informed consent obtained prior to proceeding including risk of failure, 1% risk of PDPH, risk of minor discomfort and bruising.  Discussed alternatives to epidural analgesia and patient desires to proceed.  Timeout performed pre-procedure verifying patient name, procedure, and platelet count.  Patient tolerated procedure well. ?Reason for block:procedure for pain ? ? ? ?

## 2021-04-18 NOTE — H&P (Signed)
OBSTETRIC ADMISSION HISTORY AND PHYSICAL ? ?Kendra Guerrero Guerrero is a 36 y.o. female [phone] with IUP at 16w1dby LMP presenting for IOL. She reports +FMs, No LOF, no VB, no blurry vision, headaches or peripheral edema, and RUQ pain.  She plans on breast feeding. She requests BTL for birth control. ?[facility] ? ?Dating: By LMP --->  Estimated Date of Delivery: 04/24/21 ? ?Sono:   ? ?'@[redacted]w[redacted]d'$ , CWD, normal anatomy, cephalic presentation,  32130Q 49% EFW ? ? ?Prenatal History/Complications:  ?Polyhydramnios- AFI 25 on 03/08/21, resolved on most recent [place]?Uterine Fibroids- largest 9 cm in anterior lower uterine segment ?H/o abnormal pap smear- LSIL HPV negative, repeat 09/2021 ?AMA ?Elevated blood pressure- on 03/14/21, repeat wnl, all blood pressures normal since ? ? ?Past Medical History: ?Past Medical History:  ?Diagnosis Date  ? C. difficile diarrhea 02/13/2009  ? Chlamydia   ? Clostridium difficile infection 02/13/2009  ? Fibroid   ? Hx of pyelonephritis   ? UTI (urinary tract infection)   ? Vaginal Pap smear, abnormal   ? ? ?Past Surgical History: ?Past Surgical History:  ?Procedure Laterality Date  ? CYSTECTOMY    ? back of neck  ? DILATION AND CURETTAGE OF UTERUS    ? ? ?Obstetrical History: ?OB History   ? ? Gravida  ?5  ? Para  ?2  ? Term  ?2  ? Preterm  ?   ? AB  ?2  ? Living  ?2  ?  ? ? SAB  ?1  ? IAB  ?1  ? Ectopic  ?   ? Multiple  ?0  ? Live Births  ?2  ?   ?  ?  ? ? ?Social History ?Social History  ? ?Socioeconomic History  ? Marital status: Single  ?  Spouse name: Not on file  ? Number of children: Not on file  ? Years of education: Not on file  ? Highest education level: Not on file  ?Occupational History  ? Not on file  ?Tobacco Use  ? Smoking status: Never  ? Smokeless tobacco: Never  ?Vaping Use  ? Vaping Use: Never used  ?Substance and Sexual Activity  ? Alcohol use: Not Currently  ?  Comment: occasional but not while pregnant  ? Drug use: No  ? Sexual activity: Yes  ?  Birth  control/protection: Pill, None  ?  Comment: had been taking birth controll pills  ?Other Topics Concern  ? Not on file  ?Social History Narrative  ? Not on file  ? ?Social Determinants of Health  ? ?Financial Resource Strain: Not on file  ?Food Insecurity: No Food Insecurity  ? Worried About RCharity fundraiserin the Last Year: Never true  ? Ran Out of Food in the Last Year: Never true  ?Transportation Needs: No Transportation Needs  ? Lack of Transportation (Medical): No  ? Lack of Transportation (Non-Medical): No  ?Physical Activity: Not on file  ?Stress: Not on file  ?Social Connections: Not on file  ? ? ?Family History: ?Family History  ?Problem Relation Age of Onset  ? Diabetes Mother   ? Stroke Mother   ? Healthy Father   ? Hypertension Maternal Grandmother   ? Anesthesia problems Neg Hx   ? Hypotension Neg Hx   ? Malignant hyperthermia Neg Hx   ? Pseudochol deficiency Neg Hx   ? ? ?Allergies: ?Allergies  ?Allergen Reactions  ? Apple Juice Other (See Comments)  ?  Reaction:  Unknown   ? Codeine Nausea And Vomiting  ? ? ?Pt denies allergies to latex, iodine, or shellfish. ? ?Medications Prior to Admission  ?Medication Sig Dispense Refill Last Dose  ? acetaminophen (TYLENOL) 500 MG tablet Take 500 mg by mouth every 6 (six) hours as needed.   Past Week  ? Prenatal Vit-Fe Fumarate-FA (PRENATAL MULTIVITAMIN) TABS tablet Take 1 tablet by mouth daily.    Past Month  ? ? ? ?Review of Systems  ? ?All systems reviewed and negative except as stated in HPI ? ?Blood pressure (!) 113/58, pulse 71, temperature 97.8 ?F (36.6 ?C), temperature source Oral, resp. rate 16, height '5\' 4"'$  (1.626 m), weight 121.6 kg, last menstrual period 07/18/2020, SpO2 99 %, unknown if currently breastfeeding. ?General appearance: alert, cooperative, and appears stated age ?Lungs: clear to auscultation bilaterally ?Heart: regular rate and rhythm ?Abdomen: soft, non-tender; bowel sounds normal ?Extremities: Homans sign is negative, no sign of  DVT ?Presentation: cephalic by bedside US ?Fetal monitoringBaseline: 130 bpm, Variability: Good {> 6 bpm), Accelerations: Reactive, and Decelerations: Absent ?Uterine activity irregular ?Dilation: Fingertip ?Exam by:: Dr. Thompson Grayer ?SVE fingertip/50/-3 ? ?Prenatal labs: ?ABO, Rh: --/--/A POS (03/06 6270) ?Antibody: NEG (03/06 0804) ?Rubella: Immune (08/15 0000) ?RPR: NON REACTIVE (03/06 0804)  ?HBsAg: Negative (01/16 1545)  ?HIV: Non Reactive (01/16 1546)  ?GBS: Negative/-- (02/13 1517)  ?1 hr Glucola: 120, normal ?Genetic screening:  quad screen negative, cell free DNA low risk ?Anatomy US: polyhydramnios AFI 25, otherwise normal anatomy ? ?Prenatal Transfer Tool  ?Maternal Diabetes: No ?Genetic Screening: Normal ?Maternal Ultrasounds/Referrals: Other:polyhydramnios ?Fetal Ultrasounds or other Referrals:  None ?Maternal Substance Abuse:  No ?Significant Maternal Medications:  None ?Significant Maternal Lab Results: Group B Strep negative ? ?Results for orders placed or performed during the hospital encounter of 04/18/21 (from the past 24 hour(s))  ?CBC  ? Collection Time: 04/18/21  8:04 AM  ?Result Value Ref Range  ? WBC 7.4 4.0 - 10.5 K/uL  ? RBC 4.25 3.87 - 5.11 MIL/uL  ? Hemoglobin 11.1 (L) 12.0 - 15.0 g/dL  ? HCT 34.9 (L) 36.0 - 46.0 %  ? MCV 82.1 80.0 - 100.0 fL  ? MCH 26.1 26.0 - 34.0 pg  ? MCHC 31.8 30.0 - 36.0 g/dL  ? RDW 15.0 11.5 - 15.5 %  ? Platelets 285 150 - 400 K/uL  ? nRBC 0.0 0.0 - 0.2 %  ?RPR  ? Collection Time: 04/18/21  8:04 AM  ?Result Value Ref Range  ? RPR Ser Ql NON REACTIVE NON REACTIVE  ?Type and screen  ? Collection Time: 04/18/21  8:04 AM  ?Result Value Ref Range  ? ABO/RH(D) A POS   ? Antibody Screen NEG   ? Sample Expiration    ?  04/21/2021,2359 ?Performed at [facility], [place] [address], [place], [place] [address] ?  ? ? ?Patient Active Problem List  ? Diagnosis Date Noted  ? Term pregnancy 04/18/2021  ? Unwanted fertility 03/14/2021  ? Polyhydramnios affecting pregnancy 03/10/2021   ? Supervision of high risk pregnancy, antepartum 02/28/2021  ? AMA (advanced maternal age) multigravida 35+ 02/28/2021  ? BMI 40.0-44.9, adult ([place]) 02/28/2021  ? Obesity in pregnancy 02/28/2021  ? Uterine fibroid complicating antenatal care, baby not yet delivered 02/28/2021  ? LGSIL of cervix of undetermined significance 02/28/2021  ? ? ?Assessment/Plan:  ?Kendra Guerrero is a 36 y.o. [id number] at 84w1dhere for IOL for polyhydramnios. ? ?#Labor: Presents for IOL. SVE fingertip/50/-3. Vertex by ultrasound.  Plan to start pitocin and AROM when appropriate. ?#Pain: Epidural when desires ?#FWB: Category I ?#ID:  GBS negative ?#MOF: breast ?#MOC: BTL, papers signed on 03/14/21 ?#Circ:  Desires ? ?#Polyhydramnios- resolved on most recent BPP. ? ?[name], MD  ?[facility] [place], [place] ?04/18/2021, 10:20 AM ? ? ? ?

## 2021-04-18 NOTE — Progress Notes (Signed)
Labor Progress Note ?Kendra Guerrero is a 36 y.o. [id number] at 70w1dpresented for IOL for polyhydramnios/AMA. ? ?S: Patient is resting comfortably.  At bedside due to recurrent variable decels since AROM. Patient noted to have hypotension. ? ?O:  ?BP (!) 90/47   Pulse 77   Temp 98.7 ?F (37.1 ?C) (Oral)   Resp 16   Ht '5\' 4"'$  (1.626 m)   Wt 121.6 kg   LMP 07/18/2020   SpO2 100%   BMI 46.00 kg/m?  ?EFM: 125s baseline/mod variability/+ accels, recurrent deep variables resolved with position changes and phenylephrine ?Toco: q2-4 minutes, patient comfortable with epidural ? ?CVE: Dilation: 4 ?Effacement (%): 80 ?Cervical Position: Posterior ?Station: -2 ?Presentation: Vertex ?Exam by:: Dr. PThompson Grayer? ? ?A&P: 36y.o. [id number] for IOL for polyhydramnios/AMA. ?#Labor: Progressing well. SVE repeated, no cord prolapse, head well applied, check unchanged. Position changed and variables resolved. Phenylephrine given for hypotension with improvement of blood pressure.Patient tolerated well. Continue pitocin. ?#Pain: epidural ?#FWB: moderate variability with accels, had a period of recurrent variable decels that resolved with position changes and improvement of blood pressure, overall reassuring ?#GBS negative ? ? ?MaLenoria ChimeMD ?[facility]:02 PM  ?

## 2021-04-18 NOTE — Discharge Summary (Signed)
Postpartum Discharge Summary ? ? ?   ?Patient Name: Kendra Guerrero ?DOB: 04-21-1985 ?MRN: [id number] ? ?Date of admission: 04/18/2021 ?Delivery date:04/18/2021  ?Delivering provider: Genia Del  ?Date of discharge: 04/20/2021 ? ?Admitting diagnosis: Term pregnancy [Z34.90] ?Intrauterine pregnancy: [redacted]w[redacted]d    ?Secondary diagnosis:  Principal Problem: ?  Vaginal delivery ?Active Problems: ?  Supervision of high risk pregnancy, antepartum ?  AMA (advanced maternal age) multigravida 378+?  Obesity in pregnancy ?  Uterine fibroid complicating antenatal care, baby not yet delivered ?  Polyhydramnios affecting pregnancy ?  Unwanted fertility ? ?Additional problems: none    ?Discharge diagnosis: Term Pregnancy Delivered                                              ?Post partum procedures: none ?Augmentation: AROM and Pitocin ?Complications: None ? ?Hospital course: Induction of Labor With Vaginal Delivery   ?36y.o. yo [id number] 37w1das admitted to the hospital 04/18/2021 for induction of labor.  Indication for induction: AMA and polyhydramnios .  Patient had an uncomplicated labor course and progressed to complete after Pitocin and AROM. Patient had an uncomplicated vaginal delivery.  ?Membrane Rupture Time/Date: 5:39 PM ,04/18/2021   ?Delivery Method:Vaginal, Spontaneous  ?Episiotomy: None  ?Lacerations:  Periurethral  ?Details of delivery can be found in separate delivery note.  Patient had a routine postpartum course.  She discussed BTL with Dr. StNehemiah SettlePD1 with decision made for outpatient interval BTL. She is eating, drinking, ambulating, and voiding without issue.  Her pain and bleeding is controlled.  Patient is discharged home 04/20/21. ? ?Newborn Data: ?Birth date:04/18/2021  ?Birth time:11:03 PM  ?Gender:Female  ?Living status:Living  ?Apgars:9 ,9  ?Weight:3470 g  ? ?Magnesium Sulfate received: No ?BMZ received: No ?Rhophylac: N/A ?MMR: N/A ?T-DaP: Given prenatally ?Flu: No ?Transfusion: No ? ?Physical exam   ?Vitals:  ? 04/19/21 0953 04/19/21 1340 04/19/21 2020 04/20/21 0602  ?BP: 116/73 114/65 127/74 109/70  ?Pulse: 79 81 70 70  ?Resp: 18 18 19 18   ?Temp: 98.7 ?F (37.1 ?C) 98.6 ?F (37 ?C) 97.8 ?F (36.6 ?C) 98 ?F (36.7 ?C)  ?TempSrc: Oral Oral Oral Oral  ?SpO2: 97% 99% 100% 100%  ?Weight:      ?Height:      ? ?General: alert, cooperative, and no distress ?Lochia: appropriate ?Uterine Fundus: firm ?Incision: N/A ?DVT Evaluation: No evidence of DVT seen on physical exam. ? ?Labs: ?Lab Results  ?Component Value Date  ? WBC 10.4 04/19/2021  ? HGB 9.7 (L) 04/19/2021  ? HCT 29.9 (L) 04/19/2021  ? MCV 81.3 04/19/2021  ? PLT 237 04/19/2021  ? ?CMP Latest Ref Rng & Units 01/12/2013  ?Glucose 70 - 99 mg/dL 112(H)  ?BUN 6 - 23 mg/dL 9  ?Creatinine 0.50 - 1.10 mg/dL 1.01  ?Sodium 135 - 145 mEq/L 133(L)  ?Potassium 3.5 - 5.1 mEq/L 3.1(L)  ?Chloride 96 - 112 mEq/L 98  ?CO2 19 - 32 mEq/L 23  ?Calcium 8.4 - 10.5 mg/dL 9.0  ?Total Protein 6.0 - 8.3 g/dL 7.3  ?Total Bilirubin 0.3 - 1.2 mg/dL 0.3  ?Alkaline Phos 39 - 117 U/L 62  ?AST 0 - 37 U/L 18  ?ALT 0 - 35 U/L 21  ? ?Edinburgh Score: ?Edinburgh Postnatal Depression Scale Screening Tool 04/19/2021  ?I have been able to laugh and see the funny side of  things. 0  ?I have looked forward with enjoyment to things. 0  ?I have blamed myself unnecessarily when things went wrong. 1  ?I have been anxious or worried for no good reason. 0  ?I have felt scared or panicky for no good reason. 0  ?Things have been getting on top of me. 1  ?I have been so unhappy that I have had difficulty sleeping. 0  ?I have felt sad or miserable. 0  ?I have been so unhappy that I have been crying. 0  ?The thought of harming myself has occurred to me. 0  ?Edinburgh Postnatal Depression Scale Total 2  ? ? ? ?After visit meds:  ?Allergies as of 04/20/2021   ? ?   Reactions  ? Apple Juice Other (See Comments)  ? Reaction:  Unknown   ? Codeine Nausea And Vomiting  ? ?  ? ?  ?Medication List  ?  ? ?TAKE these medications    ? ?acetaminophen 500 MG tablet ?Commonly known as: TYLENOL ?Take 500 mg by mouth every 6 (six) hours as needed. ?  ?ibuprofen 600 MG tablet ?Commonly known as: ADVIL ?Take 1 tablet (600 mg total) by mouth every 6 (six) hours. ?  ?prenatal multivitamin Tabs tablet ?Take 1 tablet by mouth daily. ?  ? ?  ? ? ? ?Discharge home in stable condition ?Infant Feeding: Breast ?Infant Disposition: home with mother ?Discharge instruction: per After Visit Summary and Postpartum booklet. ?Activity: Advance as tolerated. Pelvic rest for 6 weeks.  ?Diet: routine diet ?Future Appointments: ?Future Appointments  ?Date Time Provider [place]  ?05/31/2021  2:15 PM Renard Matter, MD [facility]  ? ?Follow up Visit: ?Message sent to [facility] by Dr. Gwenlyn Perking on 04/18/21.  ? ?Please schedule this patient for a In person postpartum visit in 6 weeks with the following provider: Any provider. ?Additional Postpartum F/U: None   ?High risk pregnancy complicated by:  AMA and polyhydramnios ?Delivery mode:  Vaginal, Spontaneous  ?Anticipated Birth Control:  interval BTL ? ?04/20/2021 ?Desma Maxim, MD ? ? ? ?

## 2021-04-18 NOTE — Lactation Note (Signed)
This note was copied from a baby's chart. ?Lactation Consultation Note ?FOB holding baby STS. [place] placed baby on mom in football position. Baby latched well. ?Mom has short shaft compressible nipples. Colostrum noted. Mom excited. ?Baby feeding well w/milk transfer noted. Breast significantly softer after just 8 minutes of BF. ?Praised mom. ?Will f/u on MBU. ? ?Patient Name: [name] ?Today's Date: 04/18/2021 ?Reason for consult: L&D Initial assessment;Term ?Age:37 hours ? ?Maternal Data ?  ? ?Feeding ?  ? ?LATCH Score ?Latch: Grasps breast easily, tongue down, lips flanged, rhythmical sucking. ? ?Audible Swallowing: A few with stimulation ? ?Type of Nipple: Everted at rest and after stimulation (short shaft/compressible) ? ?Comfort (Breast/Nipple): Soft / non-tender ? ?Hold (Positioning): Assistance needed to correctly position infant at breast and maintain latch. ? ?LATCH Score: 8 ? ? ?Lactation Tools Discussed/Used ?  ? ?Interventions ?  ? ?Discharge ?  ? ?Consult Status ?Consult Status: Follow-up from L&D ?Date: 04/19/21 ?Follow-up type: In-patient ? ? ? ?Theodoro Kalata ?04/18/2021, 11:59 PM ? ? ? ?

## 2021-04-18 NOTE — Progress Notes (Signed)
Labor Progress Note ?MIRREN GEST Spearing is a 36 y.o. [id number] at 41w1dpresented for IOL due to polyhydramnios/AMA. ? ?S: Resting comfortably in bed. Partner at bedside. No concerns at this time. ? ?O:  ?BP (!) 91/49   Pulse 74   Temp 98 ?F (36.7 ?C) (Oral)   Resp 16   Ht '5\' 4"'$  (1.626 m)   Wt 121.6 kg   LMP 07/18/2020   SpO2 100%   BMI 46.00 kg/m?  ?EFM: Baseline FHR 125 bpm /moderate variability/+accels, no decels ? ?CVE: Dilation: 6.5 ?Effacement (%): 70 ?Cervical Position: Posterior ?Station: -2 ?Presentation: Vertex ?Exam by:: MJoen LauraRN ? ? ?A&P: 36y.o. [id number]?#Labor: Progressing well. Will continue to up titrate pitocin as tolerated. Plan to reassess in 1-2 hours, sooner if changes. Anticipate SVD.  ?#Pain: Epidural ?#FWB: Cat 1 ?#GBS negative ? ?SaKarsten FellsDO PGY-1 ?04/18/2021, 9:00 PM ? ? ?

## 2021-04-18 NOTE — Progress Notes (Signed)
Labor Progress Note [name] is a 36 y.o. [id number] at 60w1dpresented for IOL for polyhydramnios/AMA. ? ?S: Patient is resting comfortably. Would like a dose of fentanyl prior to next cervical check. ? ?O:  ?BP (!) 113/52   Pulse 66   Temp 98.7 ?F (37.1 ?C) (Oral)   Resp 16   Ht '5\' 4"'$  (1.626 m)   Wt 121.6 kg   LMP 07/18/2020   SpO2 100%   BMI 46.00 kg/m?  ?EFM: 130s baseline/mod variability/+ accels, no decels ?Toco: q2-3 minutes, patient feeling them ? ?CVE: Dilation: 1.5 ?Effacement (%): 50 ?Cervical Position: Posterior ?Station: -2 ?Presentation: Vertex ?Exam by:: Dr. PThompson Grayer? ? ?A&P: 36y.o. [id number] for IOL for polyhydramnios/AMA. ?#Labor: Progressing well. SVE 1.5/50/-2, changing, offered FB but patient declined. Continue to titrate pitocin.  ?#Pain: PRN fentanyl, desires epidural eventually ?#FWB: Cat I, overall reassuring, continue to monitor closely ?#GBS negative ? ? ?MaLenoria ChimeMD ?[facility]:22 PM  ?

## 2021-04-18 NOTE — Progress Notes (Addendum)
Labor Progress Note ?Kendra Guerrero is a 36 y.o. [id number] at 48w1dpresented for IOL for polyhydramnios/AMA. ? ?S: Patient is resting comfortably. Just got an epidural. Discussed risks/benefits of AROM including cord prolapse and she is willing to proceed. ? ?O:  ?BP (!) 106/49   Pulse 93   Temp 98.7 ?F (37.1 ?C) (Oral)   Resp 16   Ht '5\' 4"'$  (1.626 m)   Wt 121.6 kg   LMP 07/18/2020   SpO2 100%   BMI 46.00 kg/m?  ?EFM: 135s baseline/mod variability/+ accels, isolated variable decel, overall reassuring ?Toco: q2-4 minutes, patient comfortable with epidural ? ?CVE: Dilation: 4 ?Effacement (%): 80 ?Cervical Position: Posterior ?Station: -2 ?Presentation: Vertex ?Exam by:: Dr. PThompson Grayer? ? ?A&P: 36y.o. [id number] for IOL for polyhydramnios/AMA. ?#Labor: Progressing well. SVE progressing, head well applied, AROM performed with moderate amount of clear fluid.Patient tolerated well. Continue pitocin. ?#Pain: epidural ?#FWB: moderate variability with accels, isolated variable decel, overall reassuring ?#GBS negative ? ? ?MaLenoria ChimeMD ?[facility]:43 PM  ?

## 2021-04-18 NOTE — Anesthesia Preprocedure Evaluation (Signed)
Anesthesia Evaluation  ?Patient identified by MRN, date of birth, ID band ?Patient awake ? ? ? ?Reviewed: ?Allergy & Precautions, H&P , NPO status , Patient's Chart, lab work & pertinent test results ? ?History of Anesthesia Complications ?Negative for: history of anesthetic complications ? ?Airway ?Mallampati: II ? ?TM Distance: >3 FB ?Neck ROM: full ? ? ? Dental ?no notable dental hx. ?(+) Teeth Intact ?  ?Pulmonary ?neg pulmonary ROS,  ?  ?Pulmonary exam normal ?breath sounds clear to auscultation ? ? ? ? ? ? Cardiovascular ?negative cardio ROS ?Normal cardiovascular exam ?Rhythm:regular Rate:Normal ? ? ?  ?Neuro/Psych ?negative neurological ROS ? negative psych ROS  ? GI/Hepatic ?negative GI ROS, Neg liver ROS,   ?Endo/Other  ?Morbid obesity ? Renal/GU ?negative Renal ROS  ?negative genitourinary ?  ?Musculoskeletal ? ? Abdominal ?(+) + obese,   ?Peds ? Hematology ? ?(+) Blood dyscrasia, anemia ,   ?Anesthesia Other Findings ? ? Reproductive/Obstetrics ?(+) Pregnancy ? ?  ? ? ? ? ? ? ? ? ? ? ? ? ? ?  ?  ? ? ? ? ? ? ? ? ?Anesthesia Physical ?Anesthesia Plan ? ?ASA: 3 ? ?Anesthesia Plan: Epidural  ? ?Post-op Pain Management:   ? ?Induction:  ? ?PONV Risk Score and Plan:  ? ?Airway Management Planned:  ? ?Additional Equipment:  ? ?Intra-op Plan:  ? ?Post-operative Plan:  ? ?Informed Consent: I have reviewed the patients History and Physical, chart, labs and discussed the procedure including the risks, benefits and alternatives for the proposed anesthesia with the patient or authorized representative who has indicated his/her understanding and acceptance.  ? ? ? ? ? ?Plan Discussed with:  ? ?Anesthesia Plan Comments:   ? ? ? ? ? ? ?Anesthesia Quick Evaluation ? ?

## 2021-04-19 ENCOUNTER — Encounter (HOSPITAL_COMMUNITY): Payer: Self-pay | Admitting: Family Medicine

## 2021-04-19 LAB — CBC
HCT: 29.9 % — ABNORMAL LOW (ref 36.0–46.0)
Hemoglobin: 9.7 g/dL — ABNORMAL LOW (ref 12.0–15.0)
MCH: 26.4 pg (ref 26.0–34.0)
MCHC: 32.4 g/dL (ref 30.0–36.0)
MCV: 81.3 fL (ref 80.0–100.0)
Platelets: 237 10*3/uL (ref 150–400)
RBC: 3.68 MIL/uL — ABNORMAL LOW (ref 3.87–5.11)
RDW: 15.1 % (ref 11.5–15.5)
WBC: 10.4 10*3/uL (ref 4.0–10.5)
nRBC: 0 % (ref 0.0–0.2)

## 2021-04-19 MED ORDER — ONDANSETRON HCL 4 MG PO TABS: 4.0000 mg | ORAL_TABLET | ORAL | Status: DC | PRN

## 2021-04-19 MED ORDER — ACETAMINOPHEN 325 MG PO TABS
650.0000 mg | ORAL_TABLET | ORAL | Status: DC | PRN
  Administered 2021-04-19: 650 mg via ORAL
  Filled 2021-04-19: qty 2

## 2021-04-19 MED ORDER — SENNOSIDES-DOCUSATE SODIUM 8.6-50 MG PO TABS
2.0000 | ORAL_TABLET | Freq: Every day | ORAL | Status: DC
  Administered 2021-04-19 – 2021-04-20 (×2): 2 via ORAL
  Filled 2021-04-19 (×2): qty 2

## 2021-04-19 MED ORDER — DIBUCAINE (PERIANAL) 1 % EX OINT: 1.0000 "application " | TOPICAL_OINTMENT | CUTANEOUS | Status: DC | PRN

## 2021-04-19 MED ORDER — ONDANSETRON HCL 4 MG/2ML IJ SOLN: 4.0000 mg | INTRAMUSCULAR | Status: DC | PRN

## 2021-04-19 MED ORDER — METOCLOPRAMIDE HCL 10 MG PO TABS: 10.0000 mg | ORAL_TABLET | Freq: Once | ORAL | Status: DC

## 2021-04-19 MED ORDER — SIMETHICONE 80 MG PO CHEW: 80.0000 mg | CHEWABLE_TABLET | ORAL | Status: DC | PRN

## 2021-04-19 MED ORDER — IBUPROFEN 600 MG PO TABS
600.0000 mg | ORAL_TABLET | Freq: Four times a day (QID) | ORAL | Status: DC
  Administered 2021-04-19 – 2021-04-20 (×7): 600 mg via ORAL
  Filled 2021-04-19 (×7): qty 1

## 2021-04-19 MED ORDER — FAMOTIDINE 20 MG PO TABS: 40.0000 mg | ORAL_TABLET | Freq: Once | ORAL | Status: DC

## 2021-04-19 MED ORDER — COCONUT OIL OIL: 1.0000 "application " | TOPICAL_OIL | Status: DC | PRN

## 2021-04-19 MED ORDER — WITCH HAZEL-GLYCERIN EX PADS: 1.0000 "application " | MEDICATED_PAD | CUTANEOUS | Status: DC | PRN

## 2021-04-19 MED ORDER — PRENATAL MULTIVITAMIN CH
1.0000 | ORAL_TABLET | Freq: Every day | ORAL | Status: DC
  Administered 2021-04-19 – 2021-04-20 (×2): 1 via ORAL
  Filled 2021-04-19 (×2): qty 1

## 2021-04-19 MED ORDER — DIPHENHYDRAMINE HCL 25 MG PO CAPS: 25.0000 mg | ORAL_CAPSULE | Freq: Four times a day (QID) | ORAL | Status: DC | PRN

## 2021-04-19 MED ORDER — LACTATED RINGERS IV SOLN: INTRAVENOUS | Status: DC

## 2021-04-19 MED ORDER — BENZOCAINE-MENTHOL 20-0.5 % EX AERO
1.0000 "application " | INHALATION_SPRAY | CUTANEOUS | Status: DC | PRN
  Administered 2021-04-19: 1 via TOPICAL
  Filled 2021-04-19: qty 56

## 2021-04-19 NOTE — Lactation Note (Signed)
This note was copied from a baby's chart. ?Lactation Consultation Note ?Mom stated baby has latched well 2 times since [facility] saw her in L&D. Praised mom. ?Baby sleeping. Mom stated baby fed about hr ago. ?Mom has 37 yr old and a 65 yr old that she didn't BF. She said her last child bit to hard so she didn't BF him. ?Mom denies painful latch w/this baby. ?Mom stated she will call today for LC to see a latch. ?Encouraged mom to rest while baby is resting. ?Encouraged to call for assistance as needed. ?Lactation sheet given. ? ?Patient Name: Kendra Guerrero ?Today's Date: 04/19/2021 ?Reason for consult: Initial assessment;1st time breastfeeding;Term ?Age:30 hours ? ?Maternal Data ?  ? ?Feeding ?  ? ?LATCH Score ?  ? ?  ? ?Type of Nipple: Everted at rest and after stimulation ? ?  ? ?  ? ?  ? ? ?Lactation Tools Discussed/Used ?  ? ?Interventions ?  ? ?Discharge ?  ? ?Consult Status ?Consult Status: Follow-up ?Date: 04/19/21 ?Follow-up type: In-patient ? ? ? ?Theodoro Kalata ?04/19/2021, 6:26 AM ? ? ? ?

## 2021-04-19 NOTE — Progress Notes (Signed)
Epidural intact to back. ?

## 2021-04-19 NOTE — Anesthesia Postprocedure Evaluation (Signed)
Anesthesia Post Note ? ?Patient: Kendra Guerrero ? ?Procedure(s) Performed: AN AD HOC LABOR EPIDURAL ? ?  ? ?Patient location during evaluation: Mother Baby ?Anesthesia Type: Epidural ?Level of consciousness: awake and alert ?Pain management: pain level controlled ?Vital Signs Assessment: post-procedure vital signs reviewed and stable ?Respiratory status: spontaneous breathing, nonlabored ventilation and respiratory function stable ?Cardiovascular status: stable ?Postop Assessment: no headache, no backache and epidural receding ?Anesthetic complications: no ? ? ?No notable events documented. ? ?Last Vitals:  ?Vitals:  ? 04/19/21 0200 04/19/21 0558  ?BP: 108/78 (!) 96/55  ?Pulse: 84 74  ?Resp: 17 16  ?Temp: 37.1 ?C 37.1 ?C  ?SpO2:    ?  ?Last Pain:  ?Vitals:  ? 04/19/21 0720  ?TempSrc:   ?PainSc: Asleep  ? ?Pain Goal:   ? ?  ?  ?  ?  ?  ?  ?  ? ?Darrius Montano ? ? ? ? ?

## 2021-04-19 NOTE — Lactation Note (Signed)
This note was copied from a baby's chart. ?Lactation Consultation Note ? ?Patient Name: Kendra Guerrero ?Today's Date: 04/19/2021 ?Reason for consult: Follow-up assessment;1st time breastfeeding;Term ?Age:37 hours ? ?Mom requested assistance with breastfeeding baby.  Baby had just spit up a mod amount of frothy mucous emesis.  Bulb syringe used by Mom.  Baby noted to have slight nasal flaring, no tachypnea or retracting, baby's color good.  No feeding cues currently.  ?Mom holding baby over her gown.  LC assisted with full STS and encouraged Mom to let baby settle and call out when baby starts showing feeding readiness.   ? ?Interventions ?Interventions: Breast feeding basics reviewed;Skin to skin;Breast massage;Hand express ? ? ?Consult Status ?Consult Status: Follow-up ?Date: 04/20/21 ?Follow-up type: In-patient ? ? ? ?Tilda Burrow E ?04/19/2021, 9:17 AM ? ? ? ?

## 2021-04-19 NOTE — Progress Notes (Signed)
Epidural left in place upon transfer to mother baby for BTL scheduled tomorrow or Wednesday.  ? ? ?Mhopkins RN ?

## 2021-04-19 NOTE — Progress Notes (Signed)
Post Partum Day 1 ?Subjective: ?Doing well. No acute events overnight. Pain is controlled and bleeding is appropriate. She is eating, drinking, voiding, and ambulating without issue. She is breast feeding which is going well. She has no other concerns at this time. ? ?Objective: ?Blood pressure 108/78, pulse 84, temperature 98.7 ?F (37.1 ?C), temperature source Oral, resp. rate 17, height '5\' 4"'$  (1.626 m), weight 121.6 kg, last menstrual period 07/18/2020, SpO2 100 %, unknown if currently breastfeeding. ? ?Physical Exam:  ?General: alert, cooperative, and no distress ?Lochia: appropriate ?Uterine Fundus: firm and below umbilicus ?DVT Evaluation: no LE edema, no calf tenderness to palpation ? ?Recent Labs  ?  04/18/21 ?0804 04/19/21 ?0510  ?HGB 11.1* 9.7*  ?HCT 34.9* 29.9*  ? ? ?Assessment/Plan: ?Kendra Guerrero is a 36 y.o. [id number] on PPD#1 s/p SVD. ? ?Progressing well. Meeting postpartum milestones. VSS. Continue routine postpartum care. ? ?Feeding: Breast ?Contraception: Postpartum BTL scheduled for 3/8, epidural left in place. NPO at midnight. ?Circumcision: Desires inpatient. Consented at bedside. ? ?Dispo: Plan for discharge on PPD#2 following BTL. ? ? LOS: 1 day  ? ?Karsten Fells, DO PGY-1 ?04/19/2021, 5:43 AM ? ? ? ?

## 2021-04-20 ENCOUNTER — Ambulatory Visit: Payer: Self-pay

## 2021-04-20 MED ORDER — IBUPROFEN 600 MG PO TABS: 600.0000 mg | ORAL_TABLET | Freq: Four times a day (QID) | ORAL | 0 refills | Status: DC

## 2021-04-20 NOTE — Lactation Note (Signed)
This note was copied from a baby's chart. ?Lactation Consultation Note ? ?Patient Name: Kendra Guerrero ?Today's Date: 04/20/2021 ?Reason for consult: Follow-up assessment;MD order;1st time breastfeeding;Term ?Age:37 hours ?Per mom infant went from 0900 am to 1500 pm  infant( 6 hours) without feeding.  ?Per mom, she doesn't have DEBP at home but she plans to go to the [facility] office tomorrow if she is discharged tonight. ?Per mom, infant had not eaten since his circumcision and infant is not latching well  very fussy now. ?Mom current feeding choice is breast and formula feeding, mom did not BF her previous two children and she would really like to breast feed her 3rd child, she is open to working on infant's latch tonight and have early morning discharge.  ?LC unable assist with latch due infant receiving 40 mls of formula at 1800 pm, infant not interested in feeding at this time. ?[place] set mom up with DEBP, mom was pumping when LC was in the room, mom was happy to see she is expressing colostrum, mom will pump every 3 hours for 15 minutes on initial setting. ?Mom was given discharge summary earlier by morning LC: engorgement prevention, signs of dehydration in infant and community resources- [facility] hotline, [facility] outpatient clinic and [place] online BF support group. ?Mom's  current feeding plan. ?1- Mom will continue to BF infant according to hunger cues, 8 to 12+ or more times within 24 hours, skin to skin. ?2- Mom knows to not  let infant go past 3 hours without feeding infant. ?3- Mom plans to offer infant any EBM that she pump first before offering formula, Mom has BF supplemental sheet regarding supplementing infant after latching infant at the breast. ?4- Mom will continue to use DEBP every 3 hours for 15 minutes on initial setting.  ?Maternal Data ?  ? ?Feeding ?Mother's Current Feeding Choice: Breast Milk and Formula ?Nipple Type: Slow - flow ? ?LATCH Score ?  ? ?  ? ?  ? ?  ? ?  ? ?  ? ? ?Lactation Tools  Discussed/Used ?Tools: Pump ?Breast pump type: Double-Electric Breast Pump ?Pump Education: Setup, frequency, and cleaning;Milk Storage ?Reason for Pumping: Infant not latching well today went 6 hours without feeding and limited voids, to help stimulate and establish milk supply. ?Pumping frequency: Mom will pump every 3 hours for 15 minutes on inital setting. ?Pumped volume: 2 mL (Mom expressing colostrum in breast flange.) ? ?Interventions ?Interventions: Skin to skin;Education;DEBP ? ?Discharge ?  ? ?Consult Status ?Consult Status: Follow-up ?Date: 04/21/21 ?Follow-up type: In-patient ? ? ? ?Kendra Guerrero ?04/20/2021, 7:11 PM ? ? ? ?

## 2021-04-20 NOTE — Lactation Note (Signed)
This note was copied from a baby's chart. ?Lactation Consultation Note ? ?Patient Name: Kendra Guerrero ?Today's Date: 04/20/2021 ?Reason for consult: Follow-up assessment;Nipple pain/trauma;Term;1st time breastfeeding ?Age:37 hours ? ?LC in to visit with P3 Mom of term baby on day of discharge.  Baby at 4% weight loss.  Mom has been exclusively breastfeeding.  Mom complaining of nipple soreness.  No visible trauma noted.  Mom to use EBM on nipples after breastfeeding. ? ?Baby swaddled sucking vigorously on pacifier.  Offered to assist/assess baby's latch to the breast.  Talked about concerns regarding early pacifier use and breastfeeding. ? ?Mom to try football hold on left breast.  Mom pinching nipple too close to where baby needs to latch.  Offered to help with a difficult hand hold.  Hand expressed drop of colostrum out onto  nipple.  Placed baby STS on Mom's left side and assisted Mom with latch.  Baby opens his mouth very wide and showed Mom how to bring baby quickly onto breast.  Baby able to sustain a deep latch with nutritive sucking and swallowing observed. ? ?Recommended keeping baby STS as much as possible to encourage frequent feedings. ? ?Engorgement prevention and treatment reviewed. ?Mom aware of OP lactation support and encouraged Mom to call prn ? ?LATCH Score ?Latch: Grasps breast easily, tongue down, lips flanged, rhythmical sucking. ? ?Audible Swallowing: Spontaneous and intermittent ? ?Type of Nipple: Everted at rest and after stimulation ? ?Comfort (Breast/Nipple): Filling, red/small blisters or bruises, mild/mod discomfort ? ?Hold (Positioning): Assistance needed to correctly position infant at breast and maintain latch. ? ?LATCH Score: 8 ? ? ?Lactation Tools Discussed/Used ?Tools: Pump ?Breast pump type: Manual ? ?Interventions ?Interventions: Breast feeding basics reviewed;Assisted with latch;Breast massage;Skin to skin;Hand express;Pre-pump if needed;Breast compression;Adjust  position;Support pillows;Position options;Expressed milk;Hand pump ? ?Discharge ?Discharge Education: Engorgement and breast care;Warning signs for feeding baby ?Pump: Manual ? ?Consult Status ?Consult Status: Complete ?Date: 04/20/21 ?Follow-up type: Call as needed ? ? ? ?Tilda Burrow E ?04/20/2021, 10:49 AM ? ? ? ?

## 2021-04-28 ENCOUNTER — Telehealth (HOSPITAL_COMMUNITY): Payer: Self-pay | Admitting: *Deleted

## 2021-04-28 NOTE — Telephone Encounter (Signed)
Phone voicemail message left to return nurse call. ? ?Odis Hollingshead, RN 04-28-2021 at 11:58am ?

## 2021-05-14 DIAGNOSIS — Z419 Encounter for procedure for purposes other than remedying health state, unspecified: Secondary | ICD-10-CM | POA: Diagnosis not present

## 2021-05-30 ENCOUNTER — Encounter: Payer: Self-pay | Admitting: *Deleted

## 2021-05-30 NOTE — Progress Notes (Signed)
? ? ?Post Partum Visit Note ? ?Kendra Guerrero is a 36 y.o. [phone] female who presents for a postpartum visit. She is 6 weeks postpartum following a normal spontaneous vaginal delivery.  I have fully reviewed the prenatal and intrapartum course. The delivery was at 39.1 gestational weeks.  Anesthesia: epidural. Postpartum course has been physically ok but at times hard emotionally (see details below). Baby is doing well. Baby is feeding by bottle - Enfamil neuropro gentle ease . Bleeding staining only. Bowel function is normal. Bladder function is normal. Patient is not sexually active. Contraception method is none. Considering Nexplanon Postpartum depression screening: negative. ? ?#Post partum emotional stress ?Reports at times things have been hard ?Feeling emotional and overwhelmed ?Tries to remain strong for family ?Has some family support ?No thoughts of harming self or others ?Would like to add more movement to her routine but its been hard ? ?The pregnancy intention screening data noted above was reviewed. Potential methods of contraception were discussed. The patient elected to proceed with Nexplanon ? ? Edinburgh Postnatal Depression Scale - 05/31/21 1427   ? ?  ? Edinburgh Postnatal Depression Scale:  In the Past 7 Days  ? I have been able to laugh and see the funny side of things. 0   ? I have looked forward with enjoyment to things. 0   ? I have blamed myself unnecessarily when things went wrong. 3   ? I have been anxious or worried for no good reason. 0   ? I have felt scared or panicky for no good reason. 0   ? Things have been getting on top of me. 0   ? I have been so unhappy that I have had difficulty sleeping. 0   ? I have felt sad or miserable. 2   ? I have been so unhappy that I have been crying. 0   ? The thought of harming myself has occurred to me. 0   ? Edinburgh Postnatal Depression Scale Total 5   ? ?  ?  ? ?  ? ? ?Health Maintenance Due  ?Topic Date Due  ? COVID-19 Vaccine (1) Never  done  ? ? ?The following portions of the patient's history were reviewed and updated as appropriate: allergies, current medications, past family history, past medical history, past social history, past surgical history, and problem list. ? ?Review of Systems ?Pertinent items are noted in HPI. ? ?Objective:  ?BP (!) 129/94   Pulse 95   Wt 252 lb (114.3 kg)   LMP 07/18/2020   Breastfeeding No   BMI 43.26 kg/m?   ? ?General:  alert, tearful at times when discussing stress and emotions  ? Breasts:  not indicated  ?Lungs: Breathing comfortably on room air  ?Heart:  Normal rate  ?Abdomen: No TTP    ?Wound none  ?GU exam:  not indicated  ?     ?Assessment:  ?Post partum ?Physically healing. Still having some spotting. Otherwise doing well. Getting about 4-5 hours of sleep per night. Emotionally feels overwhelmed at times and some days are hard. Offered BH referral and she is interested. Referral placed ? ?Plan:  ? ?Essential components of care per ACOG recommendations: ? ?1.  Mood and well being: Patient with negative depression screening today but has some symptoms that are intermittent that are concerning for depression including feeling overwhelmed, anhedonia. Reviewed local resources for support.  ?- Patient tobacco use? No.   ?- hx of drug use? No.   ? ?  2. Infant care and feeding:  ?-Patient currently breastmilk feeding? No.  ?-Social determinants of health (SDOH) reviewed in EPIC. No concerns. ? ?3. Sexuality, contraception and birth spacing ?- Patient does not want a pregnancy in the next year.  Desired family size is 3 children.  ?- Reviewed reproductive life planning. Reviewed contraceptive methods based on pt preferences and effectiveness.  Patient desired Hormonal Implant today.   ?- Discussed birth spacing of 18 months ? ?4. Sleep and fatigue ?-Encouraged family/partner/community support of 4 hrs of uninterrupted sleep to help with mood and fatigue ? ?5. Physical Recovery  ?- Discussed patients delivery  and complications. She describes her labor as bad. ?- Patient had a Vaginal, no problems at delivery. Patient had a  left periurethral  laceration. Perineal healing reviewed. Patient expressed understanding ?- Patient has urinary incontinence? No. ?- Patient is safe to resume physical and sexual activity ? ?6.  Health Maintenance ?- HM due items addressed Yes ?- Last pap smear: per chart in 09/2020 (LSIL and HPV neg), 2020 ASCUS, hpv neg. Plan for 1 year follow up in 09/2021.  ?-Breast Cancer screening indicated? No.  ? ?7. Chronic Disease/Pregnancy Condition follow up: Diastolic BP elevated today. No hx of PIH. Asymptomatic. Plan for PCP follow up ? ? ?Renard Matter, MD ?[facility] [place]  ?

## 2021-05-31 ENCOUNTER — Encounter: Payer: Self-pay | Admitting: Family Medicine

## 2021-05-31 ENCOUNTER — Ambulatory Visit (INDEPENDENT_AMBULATORY_CARE_PROVIDER_SITE_OTHER): Payer: Medicaid Other | Admitting: Family Medicine

## 2021-05-31 VITALS — BP 129/94 | HR 95 | Wt 252.0 lb

## 2021-05-31 DIAGNOSIS — Z30017 Encounter for initial prescription of implantable subdermal contraceptive: Secondary | ICD-10-CM

## 2021-05-31 DIAGNOSIS — Z9189 Other specified personal risk factors, not elsewhere classified: Secondary | ICD-10-CM

## 2021-05-31 LAB — POCT PREGNANCY, URINE: Preg Test, Ur: NEGATIVE

## 2021-05-31 MED ORDER — ETONOGESTREL 68 MG ~~LOC~~ IMPL
68.0000 mg | DRUG_IMPLANT | Freq: Once | SUBCUTANEOUS | Status: AC
  Administered 2021-05-31: 68 mg via SUBCUTANEOUS

## 2021-05-31 NOTE — Progress Notes (Signed)
? ? ? ?  GYNECOLOGY OFFICE PROCEDURE NOTE ? ?Kendra Guerrero is a 36 y.o. [id number] here for Nexplanon insertion and post partum visit. See separate note for PP visit ? ?Nexplanon Insertion Procedure ?Patient identified, informed consent performed, consent signed.   Patient does understand that irregular bleeding is a very common side effect of this medication. She was advised to have backup contraception for one week after placement. Pregnancy test in clinic today was negative.  Appropriate time out taken.  Patient's left arm was prepped and draped in the usual sterile fashion. The ruler used to measure and mark insertion area.  Patient was prepped with alcohol swab and then injected with 3 ml of 1% lidocaine.  She was prepped with betadine, Nexplanon removed from packaging,  Device confirmed in needle, then inserted full length of needle and withdrawn per handbook instructions. Nexplanon was able to palpated in the patient's arm; patient palpated the insert herself. There was minimal blood loss.  Patient insertion site covered with guaze and a pressure bandage to reduce any bruising.  The patient tolerated the procedure well and was given post procedure instructions.  ? ?Renard Matter, MD, MPH ?OB Fellow, Faculty Practice ?[facility] ? ? ? ? ?

## 2021-06-13 DIAGNOSIS — Z419 Encounter for procedure for purposes other than remedying health state, unspecified: Secondary | ICD-10-CM | POA: Diagnosis not present

## 2021-06-20 ENCOUNTER — Emergency Department (HOSPITAL_BASED_OUTPATIENT_CLINIC_OR_DEPARTMENT_OTHER)
Admission: EM | Admit: 2021-06-20 | Discharge: 2021-06-20 | Disposition: A | Discharge status: Home / Self Care | Payer: Medicaid Other | Attending: Emergency Medicine | Admitting: Emergency Medicine

## 2021-06-20 ENCOUNTER — Emergency Department (HOSPITAL_BASED_OUTPATIENT_CLINIC_OR_DEPARTMENT_OTHER): Payer: Medicaid Other

## 2021-06-20 ENCOUNTER — Other Ambulatory Visit: Payer: Self-pay

## 2021-06-20 ENCOUNTER — Encounter (HOSPITAL_BASED_OUTPATIENT_CLINIC_OR_DEPARTMENT_OTHER): Payer: Self-pay | Admitting: Emergency Medicine

## 2021-06-20 DIAGNOSIS — R072 Precordial pain: Secondary | ICD-10-CM | POA: Diagnosis not present

## 2021-06-20 DIAGNOSIS — K8020 Calculus of gallbladder without cholecystitis without obstruction: Secondary | ICD-10-CM | POA: Diagnosis not present

## 2021-06-20 DIAGNOSIS — Z743 Need for continuous supervision: Secondary | ICD-10-CM | POA: Diagnosis not present

## 2021-06-20 DIAGNOSIS — R109 Unspecified abdominal pain: Secondary | ICD-10-CM | POA: Diagnosis not present

## 2021-06-20 DIAGNOSIS — R1011 Right upper quadrant pain: Secondary | ICD-10-CM | POA: Diagnosis not present

## 2021-06-20 DIAGNOSIS — R0789 Other chest pain: Secondary | ICD-10-CM | POA: Diagnosis not present

## 2021-06-20 DIAGNOSIS — M25531 Pain in right wrist: Secondary | ICD-10-CM | POA: Diagnosis present

## 2021-06-20 DIAGNOSIS — M654 Radial styloid tenosynovitis [de Quervain]: Secondary | ICD-10-CM | POA: Diagnosis not present

## 2021-06-20 LAB — CBC
HCT: 38.5 % (ref 36.0–46.0)
Hemoglobin: 12.4 g/dL (ref 12.0–15.0)
MCH: 25.7 pg — ABNORMAL LOW (ref 26.0–34.0)
MCHC: 32.2 g/dL (ref 30.0–36.0)
MCV: 79.9 fL — ABNORMAL LOW (ref 80.0–100.0)
Platelets: 335 10*3/uL (ref 150–400)
RBC: 4.82 MIL/uL (ref 3.87–5.11)
RDW: 14.4 % (ref 11.5–15.5)
WBC: 4.9 10*3/uL (ref 4.0–10.5)
nRBC: 0 % (ref 0.0–0.2)

## 2021-06-20 LAB — URINALYSIS, ROUTINE W REFLEX MICROSCOPIC
Bilirubin Urine: NEGATIVE
Glucose, UA: NEGATIVE mg/dL
Hgb urine dipstick: NEGATIVE
Ketones, ur: NEGATIVE mg/dL
Leukocytes,Ua: NEGATIVE
Nitrite: NEGATIVE
Protein, ur: NEGATIVE mg/dL
Specific Gravity, Urine: 1.02 (ref 1.005–1.030)
pH: 7 (ref 5.0–8.0)

## 2021-06-20 LAB — LIPASE, BLOOD: Lipase: 32 U/L (ref 11–51)

## 2021-06-20 LAB — COMPREHENSIVE METABOLIC PANEL
ALT: 34 U/L (ref 0–44)
AST: 51 U/L — ABNORMAL HIGH (ref 15–41)
Albumin: 3.7 g/dL (ref 3.5–5.0)
Alkaline Phosphatase: 59 U/L (ref 38–126)
Anion gap: 6 (ref 5–15)
BUN: 13 mg/dL (ref 6–20)
CO2: 25 mmol/L (ref 22–32)
Calcium: 8.9 mg/dL (ref 8.9–10.3)
Chloride: 108 mmol/L (ref 98–111)
Creatinine, Ser: 0.86 mg/dL (ref 0.44–1.00)
GFR, Estimated: >60 mL/min (ref >60)
Glucose, Bld: 112 mg/dL — ABNORMAL HIGH (ref 70–99)
Potassium: 3.2 mmol/L — ABNORMAL LOW (ref 3.5–5.1)
Sodium: 139 mmol/L (ref 135–145)
Total Bilirubin: 0.4 mg/dL (ref 0.3–1.2)
Total Protein: 6.8 g/dL (ref 6.5–8.1)

## 2021-06-20 LAB — PREGNANCY, URINE: Preg Test, Ur: NEGATIVE

## 2021-06-20 MED ORDER — IBUPROFEN 800 MG PO TABS
800.0000 mg | ORAL_TABLET | Freq: Once | ORAL | Status: AC
  Administered 2021-06-20: 800 mg via ORAL
  Filled 2021-06-20: qty 1

## 2021-06-20 NOTE — ED Triage Notes (Signed)
Pt BIB EMS  had sudden onset right upper quad pain denies and mid sternal chest pain any n/v/d pain takes breath away.  ?

## 2021-06-20 NOTE — ED Provider Notes (Signed)
?[facility] EMERGENCY DEPARTMENT ?Provider Note ? ? ?CSN: [id number] ?Arrival date & time: 06/20/21  1628 ? ?  ? ?History ? ?Chief Complaint  ?Patient presents with  ? Abdominal Pain  ? ? ?Kendra Guerrero is a 36 y.o. female. ? ?Patient is a 36 year old female presenting for chest pain.  Patient admits to epigastric/sternal chest pain, sharp in nature, intermittent, radiating to the shoulder blades, that occurs frequently throughout the day over the past several weeks.  Today is more severe than normal.  Denies any nausea or vomiting.  Denies any fevers, chills, coughing.  Denies any occultly breathing or shortness of breath.  Does not notice any association with eating. ? ?Also admits to right wrist pain that radiates into the right thumb.  Worse with use.  New mom with a 56-monthold child at home. ? ?The history is provided by the patient. No language interpreter was used.  ?Abdominal Pain ?Associated symptoms: chest pain   ?Associated symptoms: no chills, no cough, no dysuria, no fever, no hematuria, no shortness of breath, no sore throat and no vomiting   ? ?  ? ?Home Medications ?Prior to Admission medications   ?Medication Sig Start Date End Date Taking? Authorizing Provider  ?acetaminophen (TYLENOL) 500 MG tablet Take 500 mg by mouth every 6 (six) hours as needed. ?Patient not taking: Reported on 05/31/2021    [provider]  ?ibuprofen (ADVIL) 600 MG tablet Take 1 tablet (600 mg total) by mouth every 6 (six) hours. ?Patient not taking: Reported on 05/31/2021 04/20/21   Wouk, NAilene Rud MD  ?Prenatal Vit-Fe Fumarate-FA (PRENATAL MULTIVITAMIN) TABS tablet Take 1 tablet by mouth daily.  ?Patient not taking: Reported on 05/31/2021    [provider]  ?   ? ?Allergies    ?Apple juice and Codeine   ? ?Review of Systems   ?Review of Systems  ?Constitutional:  Negative for chills and fever.  ?HENT:  Negative for ear pain and sore throat.   ?Eyes:  Negative for pain and visual disturbance.   ?Respiratory:  Negative for cough and shortness of breath.   ?Cardiovascular:  Positive for chest pain. Negative for palpitations.  ?Gastrointestinal:  Positive for abdominal pain. Negative for vomiting.  ?Genitourinary:  Negative for dysuria and hematuria.  ?Musculoskeletal:  Negative for arthralgias and back pain.  ?Skin:  Negative for color change and rash.  ?Neurological:  Negative for seizures and syncope.  ?All other systems reviewed and are negative. ? ?Physical Exam ?Updated Vital Signs ?BP 132/84 (BP Location: Right Arm)   Pulse 71   Temp 98.2 ?F (36.8 ?C) (Oral)   Resp 18   Ht '5\' 4"'$  (1.626 m)   Wt 111.1 kg   LMP 07/18/2020   SpO2 99%   Breastfeeding No   BMI 42.05 kg/m?  ?Physical Exam ?Vitals and nursing note reviewed.  ?Constitutional:   ?   General: She is not in acute distress. ?   Appearance: She is well-developed.  ?HENT:  ?   Head: Normocephalic and atraumatic.  ?Eyes:  ?   Conjunctiva/sclera: Conjunctivae normal.  ?Cardiovascular:  ?   Rate and Rhythm: Normal rate and regular rhythm.  ?   Heart sounds: No murmur heard. ?Pulmonary:  ?   Effort: Pulmonary effort is normal. No respiratory distress.  ?   Breath sounds: Normal breath sounds.  ?Abdominal:  ?   Palpations: Abdomen is soft.  ?   Tenderness: There is abdominal tenderness in the right upper quadrant. There is  no guarding or rebound.  ?Musculoskeletal:     ?   General: No swelling.  ?   Right wrist: Tenderness present. No bony tenderness. Normal pulse.  ?   Left wrist: No tenderness or bony tenderness. Normal pulse.  ?   Cervical back: Neck supple.  ?Skin: ?   General: Skin is warm and dry.  ?   Capillary Refill: Capillary refill takes less than 2 seconds.  ?Neurological:  ?   Mental Status: She is alert.  ?Psychiatric:     ?   Mood and Affect: Mood normal.  ? ? ?ED Results / Procedures / Treatments   ?Labs ?(all labs ordered are listed, but only abnormal results are displayed) ?Labs Reviewed  ?COMPREHENSIVE METABOLIC PANEL -  Abnormal; Notable for the following components:  ?    Result Value  ? Potassium 3.2 (*)   ? Glucose, Bld 112 (*)   ? AST 51 (*)   ? All other components within normal limits  ?CBC - Abnormal; Notable for the following components:  ? MCV 79.9 (*)   ? MCH 25.7 (*)   ? All other components within normal limits  ?LIPASE, BLOOD  ?URINALYSIS, ROUTINE W REFLEX MICROSCOPIC  ?PREGNANCY, URINE  ? ? ?EKG ?EKG Interpretation ? ?Date/Time:  Monday Jun 20 2021 16:40:38 EDT ?Ventricular Rate:  68 ?PR Interval:  150 ?QRS Duration: 90 ?QT Interval:  420 ?QTC Calculation: 446 ?R Axis:   80 ?Text Interpretation: Normal sinus rhythm Normal ECG Confirmed by Sherwood Gambler [phone]) on 06/21/2021 4:53:15 PM ? ?Radiology ?No results found. ? ?Procedures ?Procedures  ? ? ?Medications Ordered in ED ?Medications  ?ibuprofen (ADVIL) tablet 800 mg (800 mg Oral Given 06/20/21 1935)  ? ? ?ED Course/ Medical Decision Making/ A&P ?  ?                        ?Medical Decision Making ?Amount and/or Complexity of Data Reviewed ?Labs: ordered. ?Radiology: ordered. ? ?Risk ?Prescription drug management. ? ? ?40:17 PM ?36 year old female presenting  epigastric/sternal chest pain, sharp in nature, intermittent, radiating to the shoulder blades.  Is alert oriented x3, no acute distress, afebrile, stable vital signs.  Physical exam demonstrates tenderness to palpation of right upper quadrant.  Stable liver profile and bilirubins.  Right upper quadrant ultrasound demonstrates cholelithiasis without signs of cholecystitis.  Bile duct upper limits of normal. ? ?Patient's pain likely secondary to cholelithiasis. Given general surgery f/u if pain continues for elective cholecystectomy. Diet modifications given.  ? ?Patient also has tenderness palpation of flexor tendon of right wrist.  Likely de Quervain's tenosynovitis.  Thumb spica applied.  Given ortho follow up for steroid injection if no improvement in 2 weeks.  ? ? ?Patient in no distress and overall condition  improved here in the ED. Detailed discussions were had with the patient regarding current findings, and need for close f/u with PCP or on call doctor. The patient has been instructed to return immediately if the symptoms worsen in any way for re-evaluation. Patient verbalized understanding and is in agreement with current care plan. All questions answered prior to discharge. ? ? ? ? ? ? ? ? ?Final Clinical Impression(s) / ED Diagnoses ?Final diagnoses:  ?De Quervain's tenosynovitis, right  ?Calculus of gallbladder without cholecystitis without obstruction  ? ? ?Rx / DC Orders ?ED Discharge Orders   ? ? None  ? ?  ? ? ?  ?Lianne Cure, DO ?50/53/97 2009 ? ?

## 2021-06-21 ENCOUNTER — Telehealth: Payer: Self-pay

## 2021-06-21 NOTE — Telephone Encounter (Signed)
Transition Care Management Unsuccessful Follow-up Telephone Call ? ?Date of discharge and from where:  06/21/2021-[facility] ?Attempts:  1st Attempt ? ?Reason for unsuccessful TCM follow-up call:  Left voice message ? ?  ?

## 2021-06-22 NOTE — Telephone Encounter (Signed)
Transition Care Management Follow-up Telephone Call ?Date of discharge and from where: 06/21/2021-[facility] ?How have you been since you were released from the hospital? Patient stated that she is feeling better. Patient did not have any questions or concerns at this time.  ?Any questions or concerns? No ? ?Items Reviewed: ?Did the pt receive and understand the discharge instructions provided? Yes  ?Medications obtained and verified? Yes  ?Other? No  ?Any new allergies since your discharge? No  ?Dietary orders reviewed? No ?Do you have support at home? Yes  ? ?Functional Questionnaire: (I = Independent and D = Dependent) ?ADLs: I ? ?Bathing/Dressing- I ? ?Meal Prep- I ? ?Eating- I ? ?Maintaining continence- I ? ?Transferring/Ambulation- I ? ?Managing Meds- I ? ? ?Follow up appointments reviewed: ? ?PCP Hospital f/u appt confirmed? No  Patient is calling PCP to follow up. Patient stated that her PCP is with [facility].  ?[facility] f/u appt confirmed? No   ?Are transportation arrangements needed? No  ?If their condition worsens, is the pt aware to call PCP or go to the Emergency Dept.? Yes ?Was the patient provided with contact information for the PCP's office or ED? Yes ?Was to pt encouraged to call back with questions or concerns? Yes ? ?

## 2021-07-02 ENCOUNTER — Emergency Department (HOSPITAL_BASED_OUTPATIENT_CLINIC_OR_DEPARTMENT_OTHER): Payer: Medicaid Other

## 2021-07-02 ENCOUNTER — Emergency Department (HOSPITAL_COMMUNITY): Payer: Medicaid Other

## 2021-07-02 ENCOUNTER — Encounter (HOSPITAL_BASED_OUTPATIENT_CLINIC_OR_DEPARTMENT_OTHER): Payer: Self-pay | Admitting: Emergency Medicine

## 2021-07-02 ENCOUNTER — Observation Stay (HOSPITAL_BASED_OUTPATIENT_CLINIC_OR_DEPARTMENT_OTHER)
Admission: EM | Admit: 2021-07-02 | Discharge: 2021-07-03 | Disposition: A | Discharge status: Home / Self Care | Payer: Medicaid Other | Attending: Emergency Medicine | Admitting: Emergency Medicine

## 2021-07-02 ENCOUNTER — Emergency Department (HOSPITAL_COMMUNITY): Payer: Medicaid Other | Admitting: Certified Registered Nurse Anesthetist

## 2021-07-02 ENCOUNTER — Other Ambulatory Visit: Payer: Self-pay

## 2021-07-02 ENCOUNTER — Emergency Department (EMERGENCY_DEPARTMENT_HOSPITAL): Payer: Medicaid Other | Admitting: Certified Registered Nurse Anesthetist

## 2021-07-02 ENCOUNTER — Encounter (HOSPITAL_COMMUNITY)
Admission: EM | Disposition: A | Discharge status: Home / Self Care | Payer: Self-pay | Source: Home / Self Care | Attending: Emergency Medicine

## 2021-07-02 DIAGNOSIS — K8020 Calculus of gallbladder without cholecystitis without obstruction: Secondary | ICD-10-CM | POA: Diagnosis not present

## 2021-07-02 DIAGNOSIS — K812 Acute cholecystitis with chronic cholecystitis: Secondary | ICD-10-CM

## 2021-07-02 DIAGNOSIS — Z6841 Body Mass Index (BMI) 40.0 and over, adult: Secondary | ICD-10-CM | POA: Diagnosis not present

## 2021-07-02 DIAGNOSIS — F900 Attention-deficit hyperactivity disorder, predominantly inattentive type: Secondary | ICD-10-CM | POA: Insufficient documentation

## 2021-07-02 DIAGNOSIS — K8000 Calculus of gallbladder with acute cholecystitis without obstruction: Secondary | ICD-10-CM | POA: Diagnosis present

## 2021-07-02 DIAGNOSIS — F988 Other specified behavioral and emotional disorders with onset usually occurring in childhood and adolescence: Secondary | ICD-10-CM | POA: Diagnosis present

## 2021-07-02 DIAGNOSIS — K7581 Nonalcoholic steatohepatitis (NASH): Secondary | ICD-10-CM | POA: Insufficient documentation

## 2021-07-02 DIAGNOSIS — R1011 Right upper quadrant pain: Secondary | ICD-10-CM | POA: Diagnosis present

## 2021-07-02 DIAGNOSIS — K8010 Calculus of gallbladder with chronic cholecystitis without obstruction: Secondary | ICD-10-CM | POA: Diagnosis not present

## 2021-07-02 DIAGNOSIS — R7303 Prediabetes: Secondary | ICD-10-CM | POA: Diagnosis not present

## 2021-07-02 DIAGNOSIS — K8012 Calculus of gallbladder with acute and chronic cholecystitis without obstruction: Secondary | ICD-10-CM | POA: Diagnosis not present

## 2021-07-02 DIAGNOSIS — K8046 Calculus of bile duct with acute and chronic cholecystitis without obstruction: Secondary | ICD-10-CM | POA: Diagnosis not present

## 2021-07-02 HISTORY — PX: CHOLECYSTECTOMY: SHX55

## 2021-07-02 LAB — COMPREHENSIVE METABOLIC PANEL
ALT: 24 U/L (ref 0–44)
AST: 23 U/L (ref 15–41)
Albumin: 3.7 g/dL (ref 3.5–5.0)
Alkaline Phosphatase: 49 U/L (ref 38–126)
Anion gap: 6 (ref 5–15)
BUN: 13 mg/dL (ref 6–20)
CO2: 26 mmol/L (ref 22–32)
Calcium: 8.6 mg/dL — ABNORMAL LOW (ref 8.9–10.3)
Chloride: 107 mmol/L (ref 98–111)
Creatinine, Ser: 1.03 mg/dL — ABNORMAL HIGH (ref 0.44–1.00)
GFR, Estimated: >60 mL/min (ref >60)
Glucose, Bld: 115 mg/dL — ABNORMAL HIGH (ref 70–99)
Potassium: 3.4 mmol/L — ABNORMAL LOW (ref 3.5–5.1)
Sodium: 139 mmol/L (ref 135–145)
Total Bilirubin: 0.7 mg/dL (ref 0.3–1.2)
Total Protein: 6.8 g/dL (ref 6.5–8.1)

## 2021-07-02 LAB — CBC
HCT: 37 % (ref 36.0–46.0)
Hemoglobin: 11.9 g/dL — ABNORMAL LOW (ref 12.0–15.0)
MCH: 25.9 pg — ABNORMAL LOW (ref 26.0–34.0)
MCHC: 32.2 g/dL (ref 30.0–36.0)
MCV: 80.4 fL (ref 80.0–100.0)
Platelets: 307 10*3/uL (ref 150–400)
RBC: 4.6 MIL/uL (ref 3.87–5.11)
RDW: 14.3 % (ref 11.5–15.5)
WBC: 5.8 10*3/uL (ref 4.0–10.5)
nRBC: 0 % (ref 0.0–0.2)

## 2021-07-02 LAB — URINALYSIS, ROUTINE W REFLEX MICROSCOPIC
Bilirubin Urine: NEGATIVE
Glucose, UA: NEGATIVE mg/dL
Hgb urine dipstick: NEGATIVE
Ketones, ur: NEGATIVE mg/dL
Leukocytes,Ua: NEGATIVE
Nitrite: NEGATIVE
Protein, ur: NEGATIVE mg/dL
Specific Gravity, Urine: 1.02 (ref 1.005–1.030)
pH: 6.5 (ref 5.0–8.0)

## 2021-07-02 LAB — LIPASE, BLOOD: Lipase: 28 U/L (ref 11–51)

## 2021-07-02 LAB — PREGNANCY, URINE: Preg Test, Ur: NEGATIVE

## 2021-07-02 SURGERY — LAPAROSCOPIC CHOLECYSTECTOMY WITH INTRAOPERATIVE CHOLANGIOGRAM
Anesthesia: General | Site: Abdomen

## 2021-07-02 MED ORDER — PROPOFOL 10 MG/ML IV BOLUS
INTRAVENOUS | Status: DC | PRN
  Administered 2021-07-02: 200 mg via INTRAVENOUS

## 2021-07-02 MED ORDER — LACTATED RINGERS IV BOLUS: 1000.0000 mL | Freq: Three times a day (TID) | INTRAVENOUS | Status: DC | PRN

## 2021-07-02 MED ORDER — DEXAMETHASONE SODIUM PHOSPHATE 10 MG/ML IJ SOLN
INTRAMUSCULAR | Status: DC | PRN
  Administered 2021-07-02: 10 mg via INTRAVENOUS

## 2021-07-02 MED ORDER — ONDANSETRON HCL 4 MG/2ML IJ SOLN
4.0000 mg | Freq: Once | INTRAMUSCULAR | Status: AC
  Administered 2021-07-02: 4 mg via INTRAVENOUS
  Filled 2021-07-02: qty 2

## 2021-07-02 MED ORDER — SODIUM CHLORIDE 0.9 % IV SOLN
2.0000 g | INTRAVENOUS | Status: AC
  Administered 2021-07-02: 2 g via INTRAVENOUS

## 2021-07-02 MED ORDER — ENOXAPARIN SODIUM 40 MG/0.4ML IJ SOSY
40.0000 mg | PREFILLED_SYRINGE | INTRAMUSCULAR | Status: DC
  Administered 2021-07-03: 40 mg via SUBCUTANEOUS
  Filled 2021-07-02: qty 0.4

## 2021-07-02 MED ORDER — ONDANSETRON 4 MG PO TBDP
4.0000 mg | ORAL_TABLET | Freq: Four times a day (QID) | ORAL | Status: DC | PRN
Start: 2021-07-02 — End: 2021-07-03

## 2021-07-02 MED ORDER — LACTATED RINGERS IV SOLN: INTRAVENOUS | Status: DC | PRN

## 2021-07-02 MED ORDER — FENTANYL CITRATE (PF) 100 MCG/2ML IJ SOLN
INTRAMUSCULAR | Status: AC
  Filled 2021-07-02: qty 2

## 2021-07-02 MED ORDER — GABAPENTIN 300 MG PO CAPS
300.0000 mg | ORAL_CAPSULE | Freq: Two times a day (BID) | ORAL | Status: DC
  Administered 2021-07-02 – 2021-07-03 (×3): 300 mg via ORAL
  Filled 2021-07-02 (×3): qty 1

## 2021-07-02 MED ORDER — HYDROMORPHONE HCL 1 MG/ML IJ SOLN
0.5000 mg | INTRAMUSCULAR | Status: DC | PRN
  Administered 2021-07-02 (×2): 1 mg via INTRAVENOUS
  Filled 2021-07-02 (×2): qty 1

## 2021-07-02 MED ORDER — AMISULPRIDE (ANTIEMETIC) 5 MG/2ML IV SOLN
10.0000 mg | Freq: Once | INTRAVENOUS | Status: AC | PRN
  Administered 2021-07-02: 10 mg via INTRAVENOUS

## 2021-07-02 MED ORDER — PROCHLORPERAZINE MALEATE 10 MG PO TABS: 10.0000 mg | ORAL_TABLET | Freq: Four times a day (QID) | ORAL | Status: DC | PRN

## 2021-07-02 MED ORDER — PHENYLEPHRINE 80 MCG/ML (10ML) SYRINGE FOR IV PUSH (FOR BLOOD PRESSURE SUPPORT)
PREFILLED_SYRINGE | INTRAVENOUS | Status: DC | PRN
  Administered 2021-07-02: 120 ug via INTRAVENOUS

## 2021-07-02 MED ORDER — FENTANYL CITRATE PF 50 MCG/ML IJ SOSY
50.0000 ug | PREFILLED_SYRINGE | Freq: Once | INTRAMUSCULAR | Status: AC
  Administered 2021-07-02: 50 ug via INTRAVENOUS

## 2021-07-02 MED ORDER — BUPIVACAINE LIPOSOME 1.3 % IJ SUSP: 20.0000 mL | Freq: Once | INTRAMUSCULAR | Status: DC

## 2021-07-02 MED ORDER — CHLORHEXIDINE GLUCONATE CLOTH 2 % EX PADS: 6.0000 | MEDICATED_PAD | Freq: Once | CUTANEOUS | Status: DC

## 2021-07-02 MED ORDER — ROCURONIUM BROMIDE 10 MG/ML (PF) SYRINGE
PREFILLED_SYRINGE | INTRAVENOUS | Status: DC | PRN
  Administered 2021-07-02: 100 mg via INTRAVENOUS

## 2021-07-02 MED ORDER — MIDAZOLAM HCL 5 MG/5ML IJ SOLN
INTRAMUSCULAR | Status: DC | PRN
  Administered 2021-07-02: 2 mg via INTRAVENOUS

## 2021-07-02 MED ORDER — BUPIVACAINE LIPOSOME 1.3 % IJ SUSP
INTRAMUSCULAR | Status: AC
  Filled 2021-07-02: qty 20

## 2021-07-02 MED ORDER — FENTANYL CITRATE PF 50 MCG/ML IJ SOSY
50.0000 ug | PREFILLED_SYRINGE | Freq: Once | INTRAMUSCULAR | Status: AC
  Administered 2021-07-02: 50 ug via INTRAVENOUS
  Filled 2021-07-02: qty 1

## 2021-07-02 MED ORDER — OXYCODONE HCL 5 MG/5ML PO SOLN: 5.0000 mg | Freq: Once | ORAL | Status: DC | PRN

## 2021-07-02 MED ORDER — LIP MEDEX EX OINT
1.0000 "application " | TOPICAL_OINTMENT | Freq: Two times a day (BID) | CUTANEOUS | Status: DC
  Administered 2021-07-02 – 2021-07-03 (×3): 1 via TOPICAL
  Filled 2021-07-02: qty 7

## 2021-07-02 MED ORDER — ACETAMINOPHEN 500 MG PO TABS
1000.0000 mg | ORAL_TABLET | Freq: Four times a day (QID) | ORAL | Status: DC
  Administered 2021-07-02 – 2021-07-03 (×3): 1000 mg via ORAL
  Filled 2021-07-02 (×3): qty 2

## 2021-07-02 MED ORDER — SODIUM CHLORIDE 0.9 % IV BOLUS
1000.0000 mL | Freq: Once | INTRAVENOUS | Status: AC
Start: 2021-07-02 — End: 2021-07-02
  Administered 2021-07-02: 1000 mL via INTRAVENOUS

## 2021-07-02 MED ORDER — TRAMADOL HCL 50 MG PO TABS
50.0000 mg | ORAL_TABLET | Freq: Four times a day (QID) | ORAL | Status: DC | PRN
  Administered 2021-07-03: 100 mg via ORAL
  Filled 2021-07-02: qty 2

## 2021-07-02 MED ORDER — ONDANSETRON HCL 4 MG/2ML IJ SOLN: 4.0000 mg | Freq: Four times a day (QID) | INTRAMUSCULAR | Status: DC | PRN

## 2021-07-02 MED ORDER — BISACODYL 10 MG RE SUPP: 10.0000 mg | Freq: Every day | RECTAL | Status: DC | PRN

## 2021-07-02 MED ORDER — GABAPENTIN 300 MG PO CAPS
ORAL_CAPSULE | ORAL | Status: AC
  Filled 2021-07-02: qty 1

## 2021-07-02 MED ORDER — SODIUM CHLORIDE 0.9 % IR SOLN
Status: DC | PRN
  Administered 2021-07-02: 1000 mL

## 2021-07-02 MED ORDER — HYDROMORPHONE HCL 1 MG/ML IJ SOLN
0.2500 mg | INTRAMUSCULAR | Status: DC | PRN
  Administered 2021-07-02 (×4): 0.5 mg via INTRAVENOUS

## 2021-07-02 MED ORDER — METHOCARBAMOL 500 MG PO TABS
500.0000 mg | ORAL_TABLET | Freq: Four times a day (QID) | ORAL | Status: DC | PRN
  Administered 2021-07-03: 500 mg via ORAL
  Filled 2021-07-02: qty 1

## 2021-07-02 MED ORDER — TRAMADOL HCL 50 MG PO TABS: 50.0000 mg | ORAL_TABLET | Freq: Four times a day (QID) | ORAL | 0 refills | Status: DC | PRN

## 2021-07-02 MED ORDER — OXYCODONE HCL 5 MG PO TABS
5.0000 mg | ORAL_TABLET | ORAL | Status: DC | PRN
  Administered 2021-07-03 (×2): 10 mg via ORAL
  Filled 2021-07-02 (×2): qty 2

## 2021-07-02 MED ORDER — BUPIVACAINE-EPINEPHRINE 0.25% -1:200000 IJ SOLN
INTRAMUSCULAR | Status: DC | PRN
  Administered 2021-07-02: 30 mL

## 2021-07-02 MED ORDER — SODIUM CHLORIDE 0.9 % IV SOLN
INTRAVENOUS | Status: AC
  Filled 2021-07-02: qty 20

## 2021-07-02 MED ORDER — SIMETHICONE 80 MG PO CHEW
40.0000 mg | CHEWABLE_TABLET | Freq: Four times a day (QID) | ORAL | Status: DC | PRN
Start: 2021-07-02 — End: 2021-07-03

## 2021-07-02 MED ORDER — OXYCODONE HCL 5 MG PO TABS: 5.0000 mg | ORAL_TABLET | Freq: Once | ORAL | Status: DC | PRN

## 2021-07-02 MED ORDER — BUPIVACAINE-EPINEPHRINE (PF) 0.25% -1:200000 IJ SOLN
INTRAMUSCULAR | Status: AC
  Filled 2021-07-02: qty 30

## 2021-07-02 MED ORDER — ONDANSETRON HCL 4 MG/2ML IJ SOLN
INTRAMUSCULAR | Status: DC | PRN
  Administered 2021-07-02: 4 mg via INTRAVENOUS

## 2021-07-02 MED ORDER — METHOCARBAMOL 1000 MG/10ML IJ SOLN: 1000.0000 mg | Freq: Four times a day (QID) | INTRAVENOUS | Status: DC | PRN

## 2021-07-02 MED ORDER — METOPROLOL TARTRATE 5 MG/5ML IV SOLN: 5.0000 mg | Freq: Four times a day (QID) | INTRAVENOUS | Status: DC | PRN

## 2021-07-02 MED ORDER — FENTANYL CITRATE (PF) 100 MCG/2ML IJ SOLN
INTRAMUSCULAR | Status: DC | PRN
  Administered 2021-07-02: 100 ug via INTRAVENOUS
  Administered 2021-07-02 (×2): 50 ug via INTRAVENOUS

## 2021-07-02 MED ORDER — PROCHLORPERAZINE EDISYLATE 10 MG/2ML IJ SOLN: 5.0000 mg | Freq: Four times a day (QID) | INTRAMUSCULAR | Status: DC | PRN

## 2021-07-02 MED ORDER — LACTATED RINGERS IR SOLN
Status: DC | PRN
  Administered 2021-07-02: 1000 mL

## 2021-07-02 MED ORDER — ACETAMINOPHEN 500 MG PO TABS
ORAL_TABLET | ORAL | Status: AC
  Filled 2021-07-02: qty 2

## 2021-07-02 MED ORDER — PROMETHAZINE HCL 25 MG/ML IJ SOLN: 6.2500 mg | INTRAMUSCULAR | Status: DC | PRN

## 2021-07-02 MED ORDER — FENTANYL CITRATE PF 50 MCG/ML IJ SOSY
PREFILLED_SYRINGE | INTRAMUSCULAR | Status: AC
  Filled 2021-07-02: qty 1

## 2021-07-02 MED ORDER — PROPOFOL 10 MG/ML IV BOLUS
INTRAVENOUS | Status: AC
  Filled 2021-07-02: qty 20

## 2021-07-02 MED ORDER — ACETAMINOPHEN 500 MG PO TABS
1000.0000 mg | ORAL_TABLET | ORAL | Status: AC
  Administered 2021-07-02: 1000 mg via ORAL

## 2021-07-02 MED ORDER — MAGIC MOUTHWASH: 15.0000 mL | Freq: Four times a day (QID) | ORAL | Status: DC | PRN

## 2021-07-02 MED ORDER — CELECOXIB 200 MG PO CAPS
ORAL_CAPSULE | ORAL | Status: AC
  Filled 2021-07-02: qty 1

## 2021-07-02 MED ORDER — DEXAMETHASONE SODIUM PHOSPHATE 10 MG/ML IJ SOLN
INTRAMUSCULAR | Status: AC
  Filled 2021-07-02: qty 1

## 2021-07-02 MED ORDER — HYDROMORPHONE HCL 1 MG/ML IJ SOLN
INTRAMUSCULAR | Status: AC
  Filled 2021-07-02: qty 2

## 2021-07-02 MED ORDER — SODIUM CHLORIDE 0.9% FLUSH: 3.0000 mL | INTRAVENOUS | Status: DC | PRN

## 2021-07-02 MED ORDER — ONDANSETRON HCL 4 MG/2ML IJ SOLN
INTRAMUSCULAR | Status: AC
  Filled 2021-07-02: qty 2

## 2021-07-02 MED ORDER — DIPHENHYDRAMINE HCL 12.5 MG/5ML PO ELIX: 12.5000 mg | ORAL_SOLUTION | Freq: Four times a day (QID) | ORAL | Status: DC | PRN

## 2021-07-02 MED ORDER — SUGAMMADEX SODIUM 200 MG/2ML IV SOLN
INTRAVENOUS | Status: DC | PRN
  Administered 2021-07-02: 200 mg via INTRAVENOUS

## 2021-07-02 MED ORDER — SODIUM CHLORIDE 0.9% FLUSH
3.0000 mL | Freq: Two times a day (BID) | INTRAVENOUS | Status: DC
Start: 2021-07-02 — End: 2021-07-03
  Administered 2021-07-02: 3 mL via INTRAVENOUS

## 2021-07-02 MED ORDER — CELECOXIB 200 MG PO CAPS
200.0000 mg | ORAL_CAPSULE | ORAL | Status: AC
  Administered 2021-07-02: 200 mg via ORAL

## 2021-07-02 MED ORDER — GABAPENTIN 300 MG PO CAPS
300.0000 mg | ORAL_CAPSULE | ORAL | Status: AC
  Administered 2021-07-02: 300 mg via ORAL

## 2021-07-02 MED ORDER — MEPERIDINE HCL 50 MG/ML IJ SOLN: 6.2500 mg | INTRAMUSCULAR | Status: DC | PRN

## 2021-07-02 MED ORDER — ENSURE PRE-SURGERY PO LIQD
296.0000 mL | Freq: Once | ORAL | Status: DC
  Filled 2021-07-02: qty 296

## 2021-07-02 MED ORDER — MAGNESIUM HYDROXIDE 400 MG/5ML PO SUSP: 30.0000 mL | Freq: Every day | ORAL | Status: DC | PRN

## 2021-07-02 MED ORDER — CALCIUM POLYCARBOPHIL 625 MG PO TABS
625.0000 mg | ORAL_TABLET | Freq: Two times a day (BID) | ORAL | Status: DC
Start: 2021-07-02 — End: 2021-07-03
  Administered 2021-07-02 – 2021-07-03 (×2): 625 mg via ORAL
  Filled 2021-07-02 (×2): qty 1

## 2021-07-02 MED ORDER — SODIUM CHLORIDE 0.9 % IV SOLN: 250.0000 mL | INTRAVENOUS | Status: DC | PRN

## 2021-07-02 MED ORDER — MIDAZOLAM HCL 2 MG/2ML IJ SOLN
INTRAMUSCULAR | Status: AC
  Filled 2021-07-02: qty 2

## 2021-07-02 MED ORDER — LIDOCAINE 2% (20 MG/ML) 5 ML SYRINGE
INTRAMUSCULAR | Status: DC | PRN
  Administered 2021-07-02: 100 mg via INTRAVENOUS

## 2021-07-02 MED ORDER — METRONIDAZOLE 500 MG/100ML IV SOLN
500.0000 mg | INTRAVENOUS | Status: AC
  Administered 2021-07-02: 500 mg via INTRAVENOUS
  Filled 2021-07-02: qty 100

## 2021-07-02 MED ORDER — DIPHENHYDRAMINE HCL 50 MG/ML IJ SOLN: 12.5000 mg | Freq: Four times a day (QID) | INTRAMUSCULAR | Status: DC | PRN

## 2021-07-02 MED ORDER — BUPIVACAINE LIPOSOME 1.3 % IJ SUSP
INTRAMUSCULAR | Status: DC | PRN
Start: 2021-07-02 — End: 2021-07-02
  Administered 2021-07-02: 20 mL

## 2021-07-02 MED ORDER — AMISULPRIDE (ANTIEMETIC) 5 MG/2ML IV SOLN
INTRAVENOUS | Status: AC
  Filled 2021-07-02: qty 4

## 2021-07-02 SURGICAL SUPPLY — 47 items
APPLIER CLIP 5 13 M/L LIGAMAX5 (MISCELLANEOUS) ×2
APPLIER CLIP ROT 10 11.4 M/L (STAPLE)
BAG COUNTER SPONGE SURGICOUNT (BAG) ×2 IMPLANT
CABLE HIGH FREQUENCY MONO STRZ (ELECTRODE) IMPLANT
CLIP APPLIE 5 13 M/L LIGAMAX5 (MISCELLANEOUS) IMPLANT
CLIP APPLIE ROT 10 11.4 M/L (STAPLE) IMPLANT
COVER MAYO STAND XLG (MISCELLANEOUS) ×2 IMPLANT
COVER SURGICAL LIGHT HANDLE (MISCELLANEOUS) ×2 IMPLANT
DRAPE 3/4 80X56 (DRAPES) ×2 IMPLANT
DRAPE C-ARM 42X120 X-RAY (DRAPES) ×2 IMPLANT
DRAPE UTILITY XL STRL (DRAPES) ×2 IMPLANT
DRAPE WARM FLUID 44X44 (DRAPES) ×2 IMPLANT
DRSG TEGADERM 2-3/8X2-3/4 SM (GAUZE/BANDAGES/DRESSINGS) ×4 IMPLANT
DRSG TEGADERM 4X4.75 (GAUZE/BANDAGES/DRESSINGS) ×1 IMPLANT
DRSG TEGADERM 6X8 (GAUZE/BANDAGES/DRESSINGS) ×2 IMPLANT
ELECT REM PT RETURN 15FT ADLT (MISCELLANEOUS) ×2 IMPLANT
ENDOLOOP SUT PDS II  0 18 (SUTURE) ×1
ENDOLOOP SUT PDS II 0 18 (SUTURE) IMPLANT
GLOVE ECLIPSE 8.0 STRL XLNG CF (GLOVE) ×2 IMPLANT
GLOVE INDICATOR 8.0 STRL GRN (GLOVE) ×2 IMPLANT
GOWN STRL REUS W/ TWL XL LVL3 (GOWN DISPOSABLE) ×3 IMPLANT
GOWN STRL REUS W/TWL XL LVL3 (GOWN DISPOSABLE) ×3
IRRIG SUCT STRYKERFLOW 2 WTIP (MISCELLANEOUS) ×2
IRRIGATION SUCT STRKRFLW 2 WTP (MISCELLANEOUS) ×1 IMPLANT
KIT BASIN OR (CUSTOM PROCEDURE TRAY) ×2 IMPLANT
KIT TURNOVER KIT A (KITS) IMPLANT
PENCIL SMOKE EVACUATOR (MISCELLANEOUS) IMPLANT
POUCH RETRIEVAL ECOSAC 10 (ENDOMECHANICALS) ×1 IMPLANT
POUCH RETRIEVAL ECOSAC 10MM (ENDOMECHANICALS) ×1
SCISSORS LAP 5X35 DISP (ENDOMECHANICALS) ×2 IMPLANT
SET CHOLANGIOGRAPH MIX (MISCELLANEOUS) ×2 IMPLANT
SET TUBE SMOKE EVAC HIGH FLOW (TUBING) ×2 IMPLANT
SHEARS HARMONIC ACE PLUS 36CM (ENDOMECHANICALS) ×1 IMPLANT
SLEEVE Z-THREAD 5X100MM (TROCAR) IMPLANT
SPIKE FLUID TRANSFER (MISCELLANEOUS) ×2 IMPLANT
SPONGE GAUZE 2X2 8PLY STRL LF (GAUZE/BANDAGES/DRESSINGS) ×1 IMPLANT
SUT MNCRL AB 4-0 PS2 18 (SUTURE) ×2 IMPLANT
SUT PDS AB 1 CT  36 (SUTURE) ×2
SUT PDS AB 1 CT 36 (SUTURE) IMPLANT
SYR 20ML LL LF (SYRINGE) ×2 IMPLANT
TOWEL OR 17X26 10 PK STRL BLUE (TOWEL DISPOSABLE) ×2 IMPLANT
TOWEL OR NON WOVEN STRL DISP B (DISPOSABLE) ×2 IMPLANT
TRAY LAPAROSCOPIC (CUSTOM PROCEDURE TRAY) ×2 IMPLANT
TROCAR BLADELESS OPT 5 100 (ENDOMECHANICALS) ×2 IMPLANT
TROCAR BLADELESS OPT 5 150 (ENDOMECHANICALS) ×1 IMPLANT
TROCAR XCEL NON-BLD 11X100MML (ENDOMECHANICALS) ×2 IMPLANT
TROCAR Z-THREAD OPTICAL 5X100M (TROCAR) ×1 IMPLANT

## 2021-07-02 NOTE — Op Note (Signed)
07/02/2021  PATIENT:  Kendra Guerrero  36 y.o. female  Patient Care Team: System, Provider Not In as PCP - General  PRE-OPERATIVE DIAGNOSIS:    Acute on Chronic Calculus Cholecystitis  POST-OPERATIVE DIAGNOSIS:   Acute on Chronic Calculus Cholecystitis  Liver: Fatty steatohepatitis  PROCEDURE:  SINGLE SITE Laparoscopic cholecystectomy (CPT code [phone]) Wedge liver biopsy  SURGEON:  Adin Hector, MD, FACS.  ASSISTANT: OR Staff   ANESTHESIA:    General with endotracheal intubation Local anesthetic as a field block  EBL:  (See Anesthesia Intraoperative Record) Total I/O In: 1550 [I.V.:1350; IV Piggyback:200] Out: -   Delay start of Pharmacological VTE agent (>24hrs) due to surgical blood loss or risk of bleeding:  no  DRAINS: None   SPECIMEN:  Gallbladder & wedge of liver     DISPOSITION OF SPECIMEN:  PATHOLOGY  COUNTS:  YES  PLAN OF CARE: Admit for overnight observation  PATIENT DISPOSITION:  PACU - hemodynamically stable.  INDICATION: Pleasant woman with repeated upper abdominal pains and attacks classic for biliary colic.  Multiple ER visits.  Attack started last night and persisted.  Not able to be controlled in emergency department with Percell Miller sign suspicious for cholecystitis.  I offered cholecystectomy:  The anatomy & physiology of hepatobiliary & pancreatic function was discussed.  The pathophysiology of gallbladder dysfunction was discussed.  Natural history risks without surgery was discussed.   I feel the risks of no intervention will lead to serious problems that outweigh the operative risks; therefore, I recommended cholecystectomy to remove the pathology.  I explained laparoscopic techniques with possible need for an open approach.  Probable cholangiogram to evaluate the bilary tract was explained as well.    Risks such as bleeding, infection, abscess, leak, injury to other organs, need for further treatment, heart attack, death, and other risks were  discussed.  I noted a good likelihood this will help address the problem.  Possibility that this will not correct all abdominal symptoms was explained.  Goals of post-operative recovery were discussed as well.  We will work to minimize complications.  An educational handout further explaining the pathology and treatment options was given as well.  Questions were answered.  The patient expresses understanding & wishes to proceed with surgery.  OR FINDINGS: Gallbladder wall edema and inflammation with chronic changes in adhesions consistent with acute on chronic cholecystitis.  Narrowed fibrotic cystic duct with cholangiogram not possible.  Therefore aborted.  Liver: Fatty steatohepatitis  DESCRIPTION:   The patient was identified & brought in the operating room. The patient was positioned supine with arms tucked. SCDs were active during the entire case. The patient underwent general anesthesia without any difficulty.  The abdomen was prepped and draped in a sterile fashion. A Surgical Timeout confirmed our plan.  I made a transverse curvilinear incision through the superior umbilical fold.  I placed a 22m long port through the supraumbilical fascia using a modified Hassan cutdown technique with umbilical stalk fascial countertraction. I began carbon dioxide insufflation.  No change in end tidal CO2 measurement.   Camera inspection revealed no injury.  There are a few wispy omental adhesions I used a camera to help free off.  There were no adhesions to the anterior abdominal wall supraumbilically.  I proceeded to continue with single site technique. I placed a #5 port in left upper aspect of the wound. I placed a 5 mm atraumatic grasper in the right inferior aspect of the wound.  Freed off omental adhesions under visualization  and inspection confirmed no colon or bowel nearby  I turned attention to the right upper quadrant.  Gallbladder was edematous inflamed and rather intrahepatic.  Quite stretched out  consistent with acute on chronic cholecystitis.  The gallbladder fundus was elevated cephalad. I freed adhesions to the ventral surface of the gallbladder off carefully.  I freed the peritoneal coverings between the gallbladder and the liver on the posteriolateral and anteriomedial walls. I alternated between Harmonic & blunt Maryland dissection to help get a good critical view of the cystic artery and cystic duct.  did further dissection to free all of the gallbladder off the liver bed to get a good critical view of the infundibulum and cystic duct. I dissected out the cystic artery; and, after getting a good 360 view, ligated the anterior & posterior branches of the cystic artery close on the infundibulum using the Harmonic ultrasonic dissection.  I skeletonized the cystic duct.  I placed a clip on the infundibulum. I did a partial cystic duct-otomy and ensured patency. I placed a 5 [place] cholangiocatheter through a puncture site at the right subcostal ridge of the abdominal wall and directed it into the cystic duct.  While the cystic was somewhat thickened and edematous the lumen was very narrow.  I could not get the catheter to advance advance more than 5 mm.  Milked back but there were no trapped stones.  It was not on the valve.  Cystic duct was rather foreshortened so I do not feel safe to do a more proximal ductotomy.  Therefore I aborted on cholangiography.    I removed the cholangiocatheter.  I ligated the cystic duct with a 0 PDS Endoloop lassoed around the gallbladder and ligated at the cystic duct/common bile duct junction to good result.  I placed clips on the cystic duct x3.  I completed cystic duct transection.  I did do a wedge resection of tongue of liver to send off for pathology since it looks like she had some fatty change.  No strong evidence of cirrhosis.  I ensured hemostasis on the gallbladder fossa of the liver and elsewhere. I inspected the rest of the abdomen & detected no injury  nor bleeding elsewhere.    I removed the gallbladder out the supraumbilical fascia. I closed the fascia transversely using #1 PDS interrupted stitches. I closed the skin using 4-0 monocryl stitch.  Sterile dressing was applied. The patient was extubated & arrived in the PACU in stable condition..  I had discussed postoperative care with the patient in the holding area. I discussed operative findings, updated the patient's status, discussed probable steps to recovery, and gave postoperative recommendations to the patient's significant other, Samie Reed.  Recommendations were made.  Questions were answered.  He expressed understanding & appreciation.  Adin Hector, M.D., F.A.C.S. Gastrointestinal and Minimally Invasive Surgery [facility] [address], [place], [place] [address] [phone] Main / Paging  07/02/2021 12:41 PM

## 2021-07-02 NOTE — Transfer of Care (Signed)
Immediate Anesthesia Transfer of Care Note  Patient: Kendra Guerrero  Procedure(s) Performed: Procedure(s): LAPAROSCOPIC CHOLECYSTECTOMY SINGLE SITE;  WEDGE LIVER BIOPSY (N/A)  Patient Location: PACU  Anesthesia Type:General  Level of Consciousness: Alert, Awake, Oriented  Airway & Oxygen Therapy: Patient Spontanous Breathing  Post-op Assessment: Report given to RN  Post vital signs: Reviewed and stable  Last Vitals:  Vitals:   07/02/21 0845 07/02/21 0944  BP: 120/82 135/89  Pulse: (!) 57 66  Resp: 17 18  Temp:  36.7 C  SpO2: 63% 893%    Complications: No apparent anesthesia complications

## 2021-07-02 NOTE — ED Notes (Signed)
[facility] is here to take pt.

## 2021-07-02 NOTE — Progress Notes (Deleted)
Kendra Guerrero  30-Sep-1985 [id number]  CARE TEAM:  PCP: System, Provider Not In  Outpatient Care Team: Patient Care Team: System, Provider Not In as PCP - General  Inpatient Treatment Team: Treatment Team: Attending Provider: Davonna Belling, MD; Consulting Physician: Edison Pace Md, MD; Registered Nurse: Chriss Driver, RN   This patient is a 36 y.o.female who presents today for surgical evaluation at the request of Dr Ronnald Nian.   Chief complaint / Reason for evaluation: Recurrent gallbladder attacks.  Probable cholecystitis  Pleasant young woman.  Has had intermittent abdominal pains.  This has been going on for a while.  Usually gets right upper quadrant pain that wakes her up at night.  She cannot think of any specific food triggers but the last few attacks happened after having coffee with cream and fried chicken.  She had numerous attacks and finally went to the ER.  They suspected gallbladder etiology.  I think there is discussion about considering elective cholecystectomy but she had another attack I kept her up all night and uncomfortable.  Nausea and vomiting.  Based concerns she went to [place].  Symptoms have persisting and pain has been persisting.  Suspicion for development of acute cholecystitis with surgical consultation requested.  Patient transferred to [facility] where surgery is available.  Patient was pregnant and had her child through normal vaginal delivery in early March.  She has had some heartburn reflux issues with that but that usually controlled with over-the-counter antiacid medications.  This is different from that.  No history of liver or pancreas problems.  She occasionally vapes but no tobacco or other use.  Not much alcohol intake.  No history of cirrhosis or hepatitis.  She usually moves her bowels about every other day.  No personal family history of inflammatory bowel disease.  No prior abdominal surgeries.  She can walk 1/2-hour without  difficulty.   Assessment  Kendra Guerrero  36 y.o. female  Day of Surgery    Problem List:  Principal Problem:   Acute on chronic cholecystitis Active Problems:   BMI 40.0-44.9, adult (HCC)   Attention deficit disorder predominant inattentive type   Prediabetes   Recurrent episodes of biliary colic cholelithiasis.  Now persistent attack suspicious for acute cholecystitis  Plan:  -IV antibiotics  IV fluid resuscitation.  Nausea and pain control.  Cholecystectomy this hospitalization.   The anatomy & physiology of hepatobiliary & pancreatic function was discussed.  The pathophysiology of gallbladder dysfunction was discussed.  Natural history risks without surgery was discussed.   I feel the risks of no intervention will lead to serious problems that outweigh the operative risks; therefore, I recommended cholecystectomy to remove the pathology.  I explained laparoscopic techniques with possible need for an open approach.  Probable cholangiogram to evaluate the bilary tract was explained as well.    Risks such as bleeding, infection, diarrhea and other bowel changes, abscess, leak, injury to other organs, need for repair of tissues / organs, need for further treatment, stroke, heart attack, death, and other risks were discussed.  I noted a good likelihood this will help address the problem, but there is a chance it may not help.  Possibility that this will not correct all abdominal symptoms was explained.  Goals of post-operative recovery were discussed as well.  We will work to minimize complications.  An educational handout further explaining the pathology and treatment options was given as well.  Questions were answered.  The patient expresses  understanding & wishes to proceed with surgery.   -VTE prophylaxis- SCDs, etc -mobilize as tolerated to help recovery  I reviewed ED provider notes, last 24 h vitals and pain scores, last 48 h intake and output, last 24 h labs and trends,  and last 24 h imaging results. I have reviewed this patient's available data, including medical history, events of note, test results, etc as part of my evaluation.  A significant portion of that time was spent in counseling.  Care during the described time interval was provided by me.  This care required moderate level of medical decision making.  07/02/2021  Adin Hector, MD, FACS, MASCRS Esophageal, Gastrointestinal & Colorectal Surgery Robotic and Minimally Invasive Surgery  [facility], [place] [address], [place], [place] [address] [phone] Fax [phone] Main  CONTACT INFORMATION:  Weekday (9AM-5PM): Call [facility] main office at [phone]  Weeknight (5PM-9AM) or Weekend/Holiday: Check [email or web address] (password " [email or web address]") for General Surgery [facility] coverage  (Please, do not use SecureChat as it is not reliable communication to operating surgeons for immediate patient care)      07/02/2021      Past Medical History:  Diagnosis Date   C. difficile diarrhea 02/13/2009   Chlamydia    Clostridium difficile infection 02/13/2009   Fibroid    Hx of pyelonephritis    UTI (urinary tract infection)    Vaginal Pap smear, abnormal     Past Surgical History:  Procedure Laterality Date   CYSTECTOMY     back of neck   DILATION AND CURETTAGE OF UTERUS      Social History   Socioeconomic History   Marital status: Single    Spouse name: Not on file   Number of children: Not on file   Years of education: Not on file   Highest education level: Not on file  Occupational History   Not on file  Tobacco Use   Smoking status: Never   Smokeless tobacco: Never  Vaping Use   Vaping Use: Some days  Substance and Sexual Activity   Alcohol use: Yes    Comment: occasional but not while pregnant   Drug use: No   Sexual activity: Yes    Birth control/protection: Pill, None    Comment: had been taking birth  controll pills  Other Topics Concern   Not on file  Social History Narrative   Not on file   Social Determinants of Health   Financial Resource Strain: Not on file  Food Insecurity: No Food Insecurity   Worried About Charity fundraiser in the Last Year: Never true   Ran Out of Food in the Last Year: Never true  Transportation Needs: No Transportation Needs   Lack of Transportation (Medical): No   Lack of Transportation (Non-Medical): No  Physical Activity: Not on file  Stress: Not on file  Social Connections: Not on file  Intimate Partner Violence: Not on file    Family History  Problem Relation Age of Onset   Diabetes Mother    Stroke Mother    Healthy Father    Hypertension Maternal Grandmother    Anesthesia problems Neg Hx    Hypotension Neg Hx    Malignant hyperthermia Neg Hx    Pseudochol deficiency Neg Hx     Current Facility-Administered Medications  Medication Dose Route Frequency Provider Last Rate Last Admin   acetaminophen (TYLENOL) tablet 1,000 mg  1,000 mg Oral On  Call to OR Michael Boston, MD       bupivacaine liposome (EXPAREL) 1.3 % injection 266 mg  20 mL Infiltration Once Michael Boston, MD       cefTRIAXone (ROCEPHIN) 2 g in sodium chloride 0.9 % 100 mL IVPB  2 g Intravenous On Call to OR Michael Boston, MD       And   metroNIDAZOLE (FLAGYL) IVPB 500 mg  500 mg Intravenous On Call to OR Michael Boston, MD       celecoxib (CELEBREX) capsule 200 mg  200 mg Oral On Call to OR Michael Boston, MD       Chlorhexidine Gluconate Cloth 2 % PADS 6 each  6 each Topical Once Michael Boston, MD       And   Chlorhexidine Gluconate Cloth 2 % PADS 6 each  6 each Topical Once Michael Boston, MD       Derrill Memo ON 07/03/2021] feeding supplement (ENSURE PRE-SURGERY) liquid 296 mL  296 mL Oral Once Michael Boston, MD       gabapentin (NEURONTIN) capsule 300 mg  300 mg Oral On Call to OR Michael Boston, MD       Current Outpatient Medications  Medication Sig Dispense Refill    acetaminophen (TYLENOL) 500 MG tablet Take 500 mg by mouth every 6 (six) hours as needed. (Patient not taking: Reported on 05/31/2021)     ibuprofen (ADVIL) 600 MG tablet Take 1 tablet (600 mg total) by mouth every 6 (six) hours. (Patient not taking: Reported on 05/31/2021) 30 tablet 0   Prenatal Vit-Fe Fumarate-FA (PRENATAL MULTIVITAMIN) TABS tablet Take 1 tablet by mouth daily.  (Patient not taking: Reported on 05/31/2021)       Allergies  Allergen Reactions   Apple Juice Other (See Comments)    Reaction:  Unknown    Apple    Codeine Nausea And Vomiting    ROS:   All other systems reviewed & are negative except per HPI or as noted below: Constitutional:  No fevers, chills, sweats.  Weight stable Eyes:  No vision changes, No discharge HENT:  No sore throats, nasal drainage Lymph: No neck swelling, No bruising easily Pulmonary:  No cough, productive sputum CV: No orthopnea, PND  Patient walks 30 minutes without difficulty.  No exertional chest/neck/shoulder/arm pain.  GI:  No personal nor family history of GI/colon cancer, inflammatory bowel disease, irritable bowel syndrome, allergy such as Celiac Sprue, dietary/dairy problems, colitis, ulcers nor gastritis.  No recent sick contacts/gastroenteritis.  No travel outside the country.  No changes in diet.  Renal: No UTIs, No hematuria Genital:  No drainage, bleeding, masses Musculoskeletal: No severe joint pain.  Good ROM major joints Skin:  No sores or lesions Heme/Lymph:  No easy bleeding.  No swollen lymph nodes   BP 135/89 (BP Location: Right Arm)   Pulse 66   Temp 98.1 F (36.7 C) (Oral)   Resp 18   Ht '5\' 4"'$  (1.626 m)   Wt 113.4 kg   LMP 06/25/2020   SpO2 100%   Breastfeeding No   BMI 42.91 kg/m   Physical Exam:  Constitutional: Not cachectic.  Hygeine adequate.  Vitals signs as above.   Eyes: Pupils reactive, normal extraocular movements. Sclera nonicteric Neuro: CN II-XII intact.  No major focal sensory defects.  No  major motor deficits. Lymph: No head/neck/groin lymphadenopathy Psych:  No severe agitation.  No severe anxiety.  Judgment & insight Adequate, Oriented x4, HENT: Normocephalic, Mucus membranes moist.  No thrush.  Neck: Supple, No tracheal deviation.  No obvious thyromegaly Chest: No pain to chest wall compression.  Good respiratory excursion.  No audible wheezing CV:  Pulses intact.  regular rhythm.  No major extremity edema  Abdomen:  Obese with panniculus Hernia: Not present. Diastasis recti: Not present. Soft.   Mildly distended.  Tenderness at right upper quadrant only.  Mild Murphy sign.  No tenderness in left upper quadrant or lower abdomen. .  No hepatomegaly.  No splenomegaly  Gen:  Inguinal hernia: Not present.  Inguinal lymph nodes: without lymphadenopathy.    Rectal: (Deferred)  Ext: No obvious deformity or contracture.  Edema: Not present.  No cyanosis Skin: No major subcutaneous nodules.  Warm and dry Musculoskeletal: Severe joint rigidity not present.  No obvious clubbing.  No digital petechiae.     Results:   Labs: Results for orders placed or performed during the hospital encounter of 07/02/21 (from the past 48 hour(s))  Urinalysis, Routine w reflex microscopic Urine, Clean Catch     Status: None   Collection Time: 07/02/21  7:05 AM  Result Value Ref Range   Color, Urine YELLOW YELLOW   APPearance CLEAR CLEAR   Specific Gravity, Urine 1.020 1.005 - 1.030   pH 6.5 5.0 - 8.0   Glucose, UA NEGATIVE NEGATIVE mg/dL   Hgb urine dipstick NEGATIVE NEGATIVE   Bilirubin Urine NEGATIVE NEGATIVE   Ketones, ur NEGATIVE NEGATIVE mg/dL   Protein, ur NEGATIVE NEGATIVE mg/dL   Nitrite NEGATIVE NEGATIVE   Leukocytes,Ua NEGATIVE NEGATIVE    Comment: Microscopic not done on urines with negative protein, blood, leukocytes, nitrite, or glucose < 500 mg/dL. Performed at [facility], [address] [place], [place] [address]   Pregnancy, urine     Status: None    Collection Time: 07/02/21  7:05 AM  Result Value Ref Range   Preg Test, Ur NEGATIVE NEGATIVE    Comment:        THE SENSITIVITY OF THIS METHODOLOGY IS >20 mIU/mL. Performed at [facility], [address] [place], [place] [address]   Lipase, blood     Status: None   Collection Time: 07/02/21  7:26 AM  Result Value Ref Range   Lipase 28 11 - 51 U/L    Comment: Performed at [facility], [address] [place], [place] [address]  Comprehensive metabolic panel     Status: Abnormal   Collection Time: 07/02/21  7:26 AM  Result Value Ref Range   Sodium 139 135 - 145 mmol/L   Potassium 3.4 (L) 3.5 - 5.1 mmol/L   Chloride 107 98 - 111 mmol/L   CO2 26 22 - 32 mmol/L   Glucose, Bld 115 (H) 70 - 99 mg/dL    Comment: Glucose reference range applies only to samples taken after fasting for at least 8 hours.   BUN 13 6 - 20 mg/dL   Creatinine, Ser 1.03 (H) 0.44 - 1.00 mg/dL   Calcium 8.6 (L) 8.9 - 10.3 mg/dL   Total Protein 6.8 6.5 - 8.1 g/dL   Albumin 3.7 3.5 - 5.0 g/dL   AST 23 15 - 41 U/L   ALT 24 0 - 44 U/L   Alkaline Phosphatase 49 38 - 126 U/L   Total Bilirubin 0.7 0.3 - 1.2 mg/dL   GFR, Estimated >60 >60 mL/min    Comment: (NOTE) Calculated using the CKD-EPI Creatinine Equation (2021)    Anion gap 6 5 - 15  Comment: Performed at [facility], [address] [place], [place] [address]  CBC     Status: Abnormal   Collection Time: 07/02/21  7:26 AM  Result Value Ref Range   WBC 5.8 4.0 - 10.5 K/uL   RBC 4.60 3.87 - 5.11 MIL/uL   Hemoglobin 11.9 (L) 12.0 - 15.0 g/dL   HCT 37.0 36.0 - 46.0 %   MCV 80.4 80.0 - 100.0 fL   MCH 25.9 (L) 26.0 - 34.0 pg   MCHC 32.2 30.0 - 36.0 g/dL   RDW 14.3 11.5 - 15.5 %   Platelets 307 150 - 400 K/uL   nRBC 0.0 0.0 - 0.2 %    Comment: Performed at [facility], [address] [place], [place] [address]    Imaging / Studies: US Abdomen Limited RUQ (LIVER/GB)  Result Date: 06/20/2021 CLINICAL  DATA:  Right upper quadrant pain EXAM: ULTRASOUND ABDOMEN LIMITED RIGHT UPPER QUADRANT COMPARISON:  None Available. FINDINGS: Gallbladder: Shadowing stones. Normal wall thickness. Negative sonographic Murphy. Common bile duct: Diameter: 6.6 mm Liver: No focal lesion identified. Within normal limits in parenchymal echogenicity. Portal vein is patent on color Doppler imaging with normal direction of blood flow towards the liver. Other: Right kidney cortex may be slightly echogenic. IMPRESSION: 1. Cholelithiasis without sonographic evidence for acute cholecystitis. Common bile duct upper normal, correlate with LFTs 2. Question slightly echogenic right kidney as may be seen with medical renal disease. Correlate with appropriate laboratory values. Electronically Signed   By: Donavan Foil M.D.   On: 06/20/2021 20:13    Medications / Allergies: per chart  Antibiotics: Anti-infectives (From admission, onward)    Start     Dose/Rate Route Frequency Ordered Stop   07/02/21 1030  cefTRIAXone (ROCEPHIN) 2 g in sodium chloride 0.9 % 100 mL IVPB       See Hyperspace for full Linked Orders Report.   2 g 200 mL/hr over 30 Minutes Intravenous On call to O.R. 07/02/21 1018 07/03/21 0559   07/02/21 1030  metroNIDAZOLE (FLAGYL) IVPB 500 mg       See Hyperspace for full Linked Orders Report.   500 mg 100 mL/hr over 60 Minutes Intravenous On call to O.R. 07/02/21 1018 07/03/21 0559         Note: Portions of this report may have been transcribed using voice recognition software. Every effort was made to ensure accuracy; however, inadvertent computerized transcription errors may be present.   Any transcriptional errors that result from this process are unintentional.    Adin Hector, MD, FACS, MASCRS Esophageal, Gastrointestinal & Colorectal Surgery Robotic and Minimally Invasive Surgery  [facility], [place]  [place], [place],   [address] [phone] Fax [phone] Main  CONTACT INFORMATION:  Weekday (9AM-5PM): Call [facility] main office at [phone]  Weeknight (5PM-9AM) or Weekend/Holiday: Check [email or web address] (password " [email or web address]") for General Surgery [facility] coverage  (Please, do not use SecureChat as it is not reliable communication to operating surgeons for immediate patient care)       07/02/2021  10:22 AM

## 2021-07-02 NOTE — ED Notes (Signed)
Report given to Michael RN with [facility] 

## 2021-07-02 NOTE — Anesthesia Preprocedure Evaluation (Signed)
Anesthesia Evaluation  Patient identified by MRN, date of birth, ID band Patient awake    Reviewed: Allergy & Precautions, NPO status , Patient's Chart, lab work & pertinent test results  Airway Mallampati: II  TM Distance: >3 FB Neck ROM: Full    Dental no notable dental hx.    Pulmonary neg pulmonary ROS,    Pulmonary exam normal breath sounds clear to auscultation       Cardiovascular negative cardio ROS Normal cardiovascular exam Rhythm:Regular Rate:Normal     Neuro/Psych negative neurological ROS  negative psych ROS   GI/Hepatic negative GI ROS, Neg liver ROS,   Endo/Other  Morbid obesity  Renal/GU negative Renal ROS  negative genitourinary   Musculoskeletal negative musculoskeletal ROS (+)   Abdominal (+) + obese,   Peds negative pediatric ROS (+)  Hematology negative hematology ROS (+)   Anesthesia Other Findings   Reproductive/Obstetrics negative OB ROS                             Anesthesia Physical Anesthesia Plan  ASA: 3 and emergent  Anesthesia Plan: General   Post-op Pain Management: Dilaudid IV   Induction: Intravenous, Rapid sequence and Cricoid pressure planned  PONV Risk Score and Plan: 3 and Ondansetron, Dexamethasone, Midazolam and Treatment may vary due to age or medical condition  Airway Management Planned: Oral ETT  Additional Equipment:   Intra-op Plan:   Post-operative Plan: Extubation in OR  Informed Consent: I have reviewed the patients History and Physical, chart, labs and discussed the procedure including the risks, benefits and alternatives for the proposed anesthesia with the patient or authorized representative who has indicated his/her understanding and acceptance.     Dental advisory given  Plan Discussed with: CRNA  Anesthesia Plan Comments:         Anesthesia Quick Evaluation

## 2021-07-02 NOTE — ED Triage Notes (Signed)
Abdominal pain dizziness started about 2 am. States is 4th flair up. Hx of gall stones was seen here last week.

## 2021-07-02 NOTE — ED Provider Notes (Signed)
[facility] EMERGENCY DEPARTMENT Provider Note   CSN: [id number] Arrival date & time: 07/02/21  0645     History  Chief Complaint  Patient presents with   Abdominal Pain    Kendra Guerrero is a 36 y.o. female.  Patient here with abdominal pain, right upper quadrant pain.  Ongoing for the last 6 hours.  Has had intermittent pain for the last week or so it may be longer.  Had ultrasound here recently associated gallstones.  She had recent pregnancy back in March.  Nothing makes it worse or better except eating makes it worse.  She has had some nausea but no vomiting.  Denies any chest pain, fever, chills, diarrhea.  No other significant medical history.  Has not had abdominal surgery in the past.       Home Medications Prior to Admission medications   Medication Sig Start Date End Date Taking? Authorizing Provider  acetaminophen (TYLENOL) 500 MG tablet Take 500 mg by mouth every 6 (six) hours as needed. Patient not taking: Reported on 05/31/2021    [provider]  ibuprofen (ADVIL) 600 MG tablet Take 1 tablet (600 mg total) by mouth every 6 (six) hours. Patient not taking: Reported on 05/31/2021 04/20/21   Wouk, Ailene Rud, MD  Prenatal Vit-Fe Fumarate-FA (PRENATAL MULTIVITAMIN) TABS tablet Take 1 tablet by mouth daily.  Patient not taking: Reported on 05/31/2021    [provider]      Allergies    Apple juice, Apple, and Codeine    Review of Systems   Review of Systems  Physical Exam Updated Vital Signs BP 134/85   Pulse 67   Temp 98.4 F (36.9 C) (Oral)   Resp 17   Ht '5\' 4"'$  (1.626 m)   Wt 113.4 kg   LMP 06/25/2020   SpO2 99%   Breastfeeding No   BMI 42.91 kg/m  Physical Exam Vitals and nursing note reviewed.  Constitutional:      General: She is not in acute distress.    Appearance: She is well-developed. She is not ill-appearing.  HENT:     Head: Normocephalic and atraumatic.     Mouth/Throat:     Mouth: Mucous membranes are  moist.  Eyes:     Extraocular Movements: Extraocular movements intact.     Conjunctiva/sclera: Conjunctivae normal.  Cardiovascular:     Rate and Rhythm: Normal rate and regular rhythm.     Heart sounds: Normal heart sounds. No murmur heard. Pulmonary:     Effort: Pulmonary effort is normal. No respiratory distress.     Breath sounds: Normal breath sounds.  Abdominal:     General: Abdomen is flat.     Palpations: Abdomen is soft.     Tenderness: There is abdominal tenderness in the right upper quadrant.  Musculoskeletal:        General: No swelling.     Cervical back: Neck supple.  Skin:    General: Skin is warm and dry.     Capillary Refill: Capillary refill takes less than 2 seconds.  Neurological:     Mental Status: She is alert.  Psychiatric:        Mood and Affect: Mood normal.    ED Results / Procedures / Treatments   Labs (all labs ordered are listed, but only abnormal results are displayed) Labs Reviewed  COMPREHENSIVE METABOLIC PANEL - Abnormal; Notable for the following components:      Result Value   Potassium 3.4 (*)  Glucose, Bld 115 (*)    Creatinine, Ser 1.03 (*)    Calcium 8.6 (*)    All other components within normal limits  CBC - Abnormal; Notable for the following components:   Hemoglobin 11.9 (*)    MCH 25.9 (*)    All other components within normal limits  LIPASE, BLOOD  URINALYSIS, ROUTINE W REFLEX MICROSCOPIC  PREGNANCY, URINE    EKG None  Radiology No results found.  Procedures Procedures    Medications Ordered in ED Medications  sodium chloride 0.9 % bolus 1,000 mL (1,000 mLs Intravenous New Bag/Given 07/02/21 0727)  ondansetron (ZOFRAN) injection 4 mg (4 mg Intravenous Given 07/02/21 0731)  fentaNYL (SUBLIMAZE) injection 50 mcg (50 mcg Intravenous Given 07/02/21 0731)    ED Course/ Medical Decision Making/ A&P                           Medical Decision Making Amount and/or Complexity of Data Reviewed Labs:  ordered. Radiology: ordered.  Risk Prescription drug management.   Kendra Guerrero is here with abdominal pain.  Normal vitals.  No fever.  Differential diagnosis is cholecystitis versus biliary colic versus gastritis versus pancreatitis.  No concern for appendicitis or bowel obstruction at this time.  Patient seen here several weeks ago and had similar pain.  Ultrasound showed gallstones but no cholecystitis.  Has not followed up with surgery.  Has been having intermittent pain which appears to be associated with eating during this time.  Pain has been ongoing for the last 6 hours with associated nausea.  We will get CBC, CMP, lipase, right upper quadrant ultrasound.  We will give IV fluids, IV fentanyl and IV Zofran and reevaluate.  Per my review and interpretation of labs there is no significant anemia, electrolyte abnormality, kidney injury, leukocytosis.  Gallbladder and liver enzymes within normal limits.  Lipase within normal limits.  Do not have ultrasound at this time however suspect that she has symptomatic cholelithiasis.  Regardless of whether or not she has cholecystitis I think having her gallbladder removed is reasonable given the frequency of her symptoms and other social stressors at home especially being primary caregiver for 29-monthold.  I have talked with Dr. GJohney Mainefor general surgery who will evaluate the patient [facility] and general surgery evaluation.  Patient has been accepted by Dr. PAlvino Chapel  This chart was dictated using voice recognition software.  Despite best efforts to proofread,  errors can occur which can change the documentation meaning.         Final Clinical Impression(s) / ED Diagnoses Final diagnoses:  Symptomatic cholelithiasis    Rx / DC Orders ED Discharge Orders     None         CLennice Sites DO 07/02/21 [phone]

## 2021-07-02 NOTE — Anesthesia Postprocedure Evaluation (Signed)
Anesthesia Post Note  Patient: Aspyn Warnke Bole  Procedure(s) Performed: LAPAROSCOPIC CHOLECYSTECTOMY SINGLE SITE;  WEDGE LIVER BIOPSY (Abdomen)     Patient location during evaluation: PACU Anesthesia Type: General Level of consciousness: awake and alert Pain management: pain level controlled Vital Signs Assessment: post-procedure vital signs reviewed and stable Respiratory status: spontaneous breathing, nonlabored ventilation, respiratory function stable and patient connected to nasal cannula oxygen Cardiovascular status: blood pressure returned to baseline and stable Postop Assessment: no apparent nausea or vomiting Anesthetic complications: no   No notable events documented.  Last Vitals:  Vitals:   07/02/21 1325 07/02/21 1330  BP:  (!) 142/83  Pulse: 80 78  Resp: 13 13  Temp:  36.6 C  SpO2: 96% 95%    Last Pain:  Vitals:   07/02/21 1330  TempSrc:   PainSc: Asleep                 Tiajuana Amass

## 2021-07-02 NOTE — Discharge Instructions (Signed)
################################################################  LAPAROSCOPIC SURGERY: POST OP INSTRUCTIONS  ######################################################################  EAT Gradually transition to a high fiber diet with a fiber supplement over the next few weeks after discharge.  Start with a pureed / full liquid diet (see below)  WALK Walk an hour a day.  Control your pain to do that.    CONTROL PAIN Control pain so that you can walk, sleep, tolerate sneezing/coughing, go up/down stairs.  HAVE A BOWEL MOVEMENT DAILY Keep your bowels regular to avoid problems.  OK to try a laxative to override constipation.  OK to use an antidairrheal to slow down diarrhea.  Call if not better after 2 tries  CALL IF YOU HAVE PROBLEMS/CONCERNS Call if you are still struggling despite following these instructions. Call if you have concerns not answered by these instructions  ######################################################################    DIET: Follow a light bland diet & liquids the first 24 hours after arrival home, such as soup, liquids, starches, etc.  Be sure to drink plenty of fluids.  Quickly advance to a usual solid diet within a few days.  Avoid fast food or heavy meals as your are more likely to get nauseated or have irregular bowels.  A low-fat, high-fiber diet for the rest of your life is ideal.  Take your usually prescribed home medications unless otherwise directed.  PAIN CONTROL: Pain is best controlled by a usual combination of three different methods TOGETHER: Ice/Heat Over the counter pain medication Prescription pain medication Most patients will experience some swelling and bruising around the incisions.  Ice packs or heating pads (30-60 minutes up to 6 times a day) will help. Use ice for the first few days to help decrease swelling and bruising, then switch to heat to help relax tight/sore spots and speed recovery.  Some people prefer to use ice alone, heat  alone, alternating between ice & heat.  Experiment to what works for you.  Swelling and bruising can take several weeks to resolve.   It is helpful to take an over-the-counter pain medication regularly for the first few weeks.  Choose one of the following that works best for you: Naproxen (Aleve, etc)  Two 260m tabs twice a day Ibuprofen (Advil, etc) Three 2033mtabs four times a day (every meal & bedtime) Acetaminophen (Tylenol, etc) 500-65061mour times a day (every meal & bedtime) A  prescription for pain medication (such as oxycodone, hydrocodone, tramadol, gabapentin, methocarbamol, etc) should be given to you upon discharge.  Take your pain medication as prescribed.  If you are having problems/concerns with the prescription medicine (does not control pain, nausea, vomiting, rash, itching, etc), please call us [phone] see if we need to switch you to a different pain medicine that will work better for you and/or control your side effect better. If you need a refill on your pain medication, please give us [place] hour notice.  contact your pharmacy.  They will contact our office to request authorization. Prescriptions will not be filled after 5 pm or on week-ends  Avoid getting constipated.   Between the surgery and the pain medications, it is common to experience some constipation.   Increasing fluid intake and taking a fiber supplement (such as Metamucil, Citrucel, FiberCon, MiraLax, etc) 1-2 times a day regularly will usually help prevent this problem from occurring.   A mild laxative (prune juice, Milk of Magnesia, MiraLax, etc) should be taken according to package directions if there are no bowel movements after 48 hours.   Watch out for diarrhea.  If you have many loose bowel movements, simplify your diet to bland foods & liquids for a few days.   Stop any stool softeners and decrease your fiber supplement.   Switching to mild anti-diarrheal medications (Kayopectate, Pepto Bismol) can  help.   If this worsens or does not improve, please call us.  Wash / shower every day.  You may shower over the dressings as they are waterproof.  Continue to shower over incision(s) after the dressing is off.  It is good for closed incisions and even open wounds to be washed every day.  Shower every day.  Short baths are fine.  Wash the incisions and wounds clean with soap & water.    You may leave closed incisions open to air if it is dry.   You may cover the incision with clean gauze & replace it after your daily shower for comfort.  TEGADERM:  You have clear gauze band-aid dressings over your closed incision(s).  Remove the dressings 3 days after surgery.    ACTIVITIES as tolerated:   You may resume regular (light) daily activities beginning the next day--such as daily self-care, walking, climbing stairs--gradually increasing activities as tolerated.  If you can walk 30 minutes without difficulty, it is safe to try more intense activity such as jogging, treadmill, bicycling, low-impact aerobics, swimming, etc. Save the most intensive and strenuous activity for last such as sit-ups, heavy lifting, contact sports, etc  Refrain from any heavy lifting or straining until you are off narcotics for pain control.   DO NOT PUSH THROUGH PAIN.  Let pain be your guide: If it hurts to do something, don't do it.  Pain is your body warning you to avoid that activity for another week until the pain goes down. You may drive when you are no longer taking prescription pain medication, you can comfortably wear a seatbelt, and you can safely maneuver your car and apply brakes. You may have sexual intercourse when it is comfortable.  FOLLOW UP in our office Please call [facility] at [phone] to set up an appointment to see your surgeon in the office for a follow-up appointment approximately 2-3 weeks after your surgery. Make sure that you call for this appointment the day you arrive home to insure a convenient  appointment time.  10. IF YOU HAVE DISABILITY OR FAMILY LEAVE FORMS, BRING THEM TO THE OFFICE FOR PROCESSING.  DO NOT GIVE THEM TO YOUR DOCTOR.   WHEN TO CALL US [phone]: Poor pain control Reactions / problems with new medications (rash/itching, nausea, etc)  Fever over 101.5 F (38.5 C) Inability to urinate Nausea and/or vomiting Worsening swelling or bruising Continued bleeding from incision. Increased pain, redness, or drainage from the incision   The clinic staff is available to answer your questions during regular business hours (8:30am-5pm).  Please don't hesitate to call and ask to speak to one of our nurses for clinical concerns.   If you have a medical emergency, go to the nearest emergency room or call 911.  A surgeon from [facility] is always on call at the hospitals   [facility] [address], [address], [place], [place]  [address] ? MAIN: [phone] ? TOLL FREE: [phone] ?  FAX [phone] [email or web address]  ##############################################################  

## 2021-07-02 NOTE — Anesthesia Procedure Notes (Signed)
Procedure Name: Intubation Date/Time: 07/02/2021 10:59 AM Performed by: Gerald Leitz, CRNA Pre-anesthesia Checklist: Patient identified, Patient being monitored, Timeout performed, Emergency Drugs available and Suction available Patient Re-evaluated:Patient Re-evaluated prior to induction Oxygen Delivery Method: Circle system utilized Preoxygenation: Pre-oxygenation with 100% oxygen Induction Type: IV induction Ventilation: Mask ventilation without difficulty Laryngoscope Size: Mac and 3 Grade View: Grade I Tube type: Oral Tube size: 7.5 mm Number of attempts: 1 Airway Equipment and Method: Stylet Placement Confirmation: ETT inserted through vocal cords under direct vision, positive ETCO2 and breath sounds checked- equal and bilateral Secured at: 21 cm Tube secured with: Tape Dental Injury: Teeth and Oropharynx as per pre-operative assessment

## 2021-07-02 NOTE — H&P (Signed)
Kendra Guerrero  February 03, 1986 [id number]  CARE TEAM:  PCP: System, Provider Not In  Outpatient Care Team: Patient Care Team: System, Provider Not In as PCP - General  Inpatient Treatment Team: Treatment Team: Attending Provider: Davonna Belling, MD; Consulting Physician: Edison Pace Md, MD; Registered Nurse: Chriss Driver, RN   This patient is a 36 y.o.female who presents today for surgical evaluation at the request of Dr Ronnald Nian.   Chief complaint / Reason for evaluation: Recurrent gallbladder attacks.  Probable cholecystitis  Pleasant young woman.  Has had intermittent abdominal pains.  This has been going on for a while.  Usually gets right upper quadrant pain that wakes her up at night.  She cannot think of any specific food triggers but the last few attacks happened after having coffee with cream and fried chicken.  She had numerous attacks and finally went to the ER.  They suspected gallbladder etiology.  I think there is discussion about considering elective cholecystectomy but she had another attack I kept her up all night and uncomfortable.  Nausea and vomiting.  Based concerns she went to [place].  Symptoms have persisting and pain has been persisting.  Suspicion for development of acute cholecystitis with surgical consultation requested.  Patient transferred to [facility] where surgery is available.  Patient was pregnant and had her child through normal vaginal delivery in early March.  She has had some heartburn reflux issues with that but that usually controlled with over-the-counter antiacid medications.  This is different from that.  No history of liver or pancreas problems.  She occasionally vapes but no tobacco or other use.  Not much alcohol intake.  No history of cirrhosis or hepatitis.  She usually moves her bowels about every other day.  No personal family history of inflammatory bowel disease.  No prior abdominal surgeries.  She can walk 1/2-hour without  difficulty.   Assessment  Kendra Guerrero  36 y.o. female  Day of Surgery    Problem List:  Principal Problem:   Acute on chronic cholecystitis Active Problems:   BMI 40.0-44.9, adult (HCC)   Attention deficit disorder predominant inattentive type   Prediabetes   Recurrent episodes of biliary colic cholelithiasis.  Now persistent attack suspicious for acute cholecystitis  Plan:  -IV antibiotics  IV fluid resuscitation.  Nausea and pain control.  Cholecystectomy this hospitalization.   The anatomy & physiology of hepatobiliary & pancreatic function was discussed.  The pathophysiology of gallbladder dysfunction was discussed.  Natural history risks without surgery was discussed.   I feel the risks of no intervention will lead to serious problems that outweigh the operative risks; therefore, I recommended cholecystectomy to remove the pathology.  I explained laparoscopic techniques with possible need for an open approach.  Probable cholangiogram to evaluate the bilary tract was explained as well.    Risks such as bleeding, infection, diarrhea and other bowel changes, abscess, leak, injury to other organs, need for repair of tissues / organs, need for further treatment, stroke, heart attack, death, and other risks were discussed.  I noted a good likelihood this will help address the problem, but there is a chance it may not help.  Possibility that this will not correct all abdominal symptoms was explained.  Goals of post-operative recovery were discussed as well.  We will work to minimize complications.  An educational handout further explaining the pathology and treatment options was given as well.  Questions were answered.  The patient expresses understanding &  wishes to proceed with surgery.   -VTE prophylaxis- SCDs, etc -mobilize as tolerated to help recovery  I reviewed ED provider notes, last 24 h vitals and pain scores, last 48 h intake and output, last 24 h labs and trends,  and last 24 h imaging results. I have reviewed this patient's available data, including medical history, events of note, test results, etc as part of my evaluation.  A significant portion of that time was spent in counseling.  Care during the described time interval was provided by me.  This care required moderate level of medical decision making.  07/02/2021  Adin Hector, MD, FACS, MASCRS Esophageal, Gastrointestinal & Colorectal Surgery Robotic and Minimally Invasive Surgery  [facility], [place] [address], [place], [place] [address] [phone] Fax [phone] Main  CONTACT INFORMATION:  Weekday (9AM-5PM): Call [facility] main office at [phone]  Weeknight (5PM-9AM) or Weekend/Holiday: Check [email or web address] (password " [email or web address]") for General Surgery [facility] coverage  (Please, do not use SecureChat as it is not reliable communication to operating surgeons for immediate patient care)      07/02/2021      Past Medical History:  Diagnosis Date   C. difficile diarrhea 02/13/2009   Chlamydia    Clostridium difficile infection 02/13/2009   Fibroid    Hx of pyelonephritis    UTI (urinary tract infection)    Vaginal Pap smear, abnormal     Past Surgical History:  Procedure Laterality Date   CYSTECTOMY     back of neck   DILATION AND CURETTAGE OF UTERUS      Social History   Socioeconomic History   Marital status: Single    Spouse name: Not on file   Number of children: Not on file   Years of education: Not on file   Highest education level: Not on file  Occupational History   Not on file  Tobacco Use   Smoking status: Never   Smokeless tobacco: Never  Vaping Use   Vaping Use: Some days  Substance and Sexual Activity   Alcohol use: Yes    Comment: occasional but not while pregnant   Drug use: No   Sexual activity: Yes    Birth control/protection: Pill, None    Comment: had been taking birth  controll pills  Other Topics Concern   Not on file  Social History Narrative   Not on file   Social Determinants of Health   Financial Resource Strain: Not on file  Food Insecurity: No Food Insecurity   Worried About Charity fundraiser in the Last Year: Never true   Ran Out of Food in the Last Year: Never true  Transportation Needs: No Transportation Needs   Lack of Transportation (Medical): No   Lack of Transportation (Non-Medical): No  Physical Activity: Not on file  Stress: Not on file  Social Connections: Not on file  Intimate Partner Violence: Not on file    Family History  Problem Relation Age of Onset   Diabetes Mother    Stroke Mother    Healthy Father    Hypertension Maternal Grandmother    Anesthesia problems Neg Hx    Hypotension Neg Hx    Malignant hyperthermia Neg Hx    Pseudochol deficiency Neg Hx     Current Facility-Administered Medications  Medication Dose Route Frequency Provider Last Rate Last Admin   acetaminophen (TYLENOL) tablet 1,000 mg  1,000 mg Oral On Call to  OR Michael Boston, MD       bupivacaine liposome (EXPAREL) 1.3 % injection 266 mg  20 mL Infiltration Once Michael Boston, MD       cefTRIAXone (ROCEPHIN) 2 g in sodium chloride 0.9 % 100 mL IVPB  2 g Intravenous On Call to OR Michael Boston, MD       And   metroNIDAZOLE (FLAGYL) IVPB 500 mg  500 mg Intravenous On Call to OR Michael Boston, MD       celecoxib (CELEBREX) capsule 200 mg  200 mg Oral On Call to OR Michael Boston, MD       Chlorhexidine Gluconate Cloth 2 % PADS 6 each  6 each Topical Once Michael Boston, MD       And   Chlorhexidine Gluconate Cloth 2 % PADS 6 each  6 each Topical Once Michael Boston, MD       Derrill Memo ON 07/03/2021] feeding supplement (ENSURE PRE-SURGERY) liquid 296 mL  296 mL Oral Once Michael Boston, MD       gabapentin (NEURONTIN) capsule 300 mg  300 mg Oral On Call to OR Michael Boston, MD       Current Outpatient Medications  Medication Sig Dispense Refill    acetaminophen (TYLENOL) 500 MG tablet Take 500 mg by mouth every 6 (six) hours as needed. (Patient not taking: Reported on 05/31/2021)     ibuprofen (ADVIL) 600 MG tablet Take 1 tablet (600 mg total) by mouth every 6 (six) hours. (Patient not taking: Reported on 05/31/2021) 30 tablet 0   Prenatal Vit-Fe Fumarate-FA (PRENATAL MULTIVITAMIN) TABS tablet Take 1 tablet by mouth daily.  (Patient not taking: Reported on 05/31/2021)       Allergies  Allergen Reactions   Apple Juice Other (See Comments)    Reaction:  Unknown    Apple    Codeine Nausea And Vomiting    ROS:   All other systems reviewed & are negative except per HPI or as noted below: Constitutional:  No fevers, chills, sweats.  Weight stable Eyes:  No vision changes, No discharge HENT:  No sore throats, nasal drainage Lymph: No neck swelling, No bruising easily Pulmonary:  No cough, productive sputum CV: No orthopnea, PND  Patient walks 30 minutes without difficulty.  No exertional chest/neck/shoulder/arm pain.  GI:  No personal nor family history of GI/colon cancer, inflammatory bowel disease, irritable bowel syndrome, allergy such as Celiac Sprue, dietary/dairy problems, colitis, ulcers nor gastritis.  No recent sick contacts/gastroenteritis.  No travel outside the country.  No changes in diet.  Renal: No UTIs, No hematuria Genital:  No drainage, bleeding, masses Musculoskeletal: No severe joint pain.  Good ROM major joints Skin:  No sores or lesions Heme/Lymph:  No easy bleeding.  No swollen lymph nodes   BP 135/89 (BP Location: Right Arm)   Pulse 66   Temp 98.1 F (36.7 C) (Oral)   Resp 18   Ht '5\' 4"'$  (1.626 m)   Wt 113.4 kg   LMP 06/25/2020   SpO2 100%   Breastfeeding No   BMI 42.91 kg/m   Physical Exam:  Constitutional: Not cachectic.  Hygeine adequate.  Vitals signs as above.   Eyes: Pupils reactive, normal extraocular movements. Sclera nonicteric Neuro: CN II-XII intact.  No major focal sensory defects.  No  major motor deficits. Lymph: No head/neck/groin lymphadenopathy Psych:  No severe agitation.  No severe anxiety.  Judgment & insight Adequate, Oriented x4, HENT: Normocephalic, Mucus membranes moist.  No thrush.   Neck:  Supple, No tracheal deviation.  No obvious thyromegaly Chest: No pain to chest wall compression.  Good respiratory excursion.  No audible wheezing CV:  Pulses intact.  regular rhythm.  No major extremity edema  Abdomen:  Obese with panniculus Hernia: Not present. Diastasis recti: Not present. Soft.   Mildly distended.  Tenderness at right upper quadrant only.  Mild Murphy sign.  No tenderness in left upper quadrant or lower abdomen..  No hepatomegaly.  No splenomegaly  Gen:  Inguinal hernia: Not present.  Inguinal lymph nodes: without lymphadenopathy.    Rectal: (Deferred)  Ext: No obvious deformity or contracture.  Edema: Not present.  No cyanosis Skin: No major subcutaneous nodules.  Warm and dry Musculoskeletal: Severe joint rigidity not present.  No obvious clubbing.  No digital petechiae.     Results:   Labs: Results for orders placed or performed during the hospital encounter of 07/02/21 (from the past 48 hour(s))  Urinalysis, Routine w reflex microscopic Urine, Clean Catch     Status: None   Collection Time: 07/02/21  7:05 AM  Result Value Ref Range   Color, Urine YELLOW YELLOW   APPearance CLEAR CLEAR   Specific Gravity, Urine 1.020 1.005 - 1.030   pH 6.5 5.0 - 8.0   Glucose, UA NEGATIVE NEGATIVE mg/dL   Hgb urine dipstick NEGATIVE NEGATIVE   Bilirubin Urine NEGATIVE NEGATIVE   Ketones, ur NEGATIVE NEGATIVE mg/dL   Protein, ur NEGATIVE NEGATIVE mg/dL   Nitrite NEGATIVE NEGATIVE   Leukocytes,Ua NEGATIVE NEGATIVE    Comment: Microscopic not done on urines with negative protein, blood, leukocytes, nitrite, or glucose < 500 mg/dL. Performed at [facility], [address] [place], [place] [address]   Pregnancy, urine     Status: None    Collection Time: 07/02/21  7:05 AM  Result Value Ref Range   Preg Test, Ur NEGATIVE NEGATIVE    Comment:        THE SENSITIVITY OF THIS METHODOLOGY IS >20 mIU/mL. Performed at [facility], [address] [place], [place] [address]   Lipase, blood     Status: None   Collection Time: 07/02/21  7:26 AM  Result Value Ref Range   Lipase 28 11 - 51 U/L    Comment: Performed at [facility], [address] [place], [place] [address]  Comprehensive metabolic panel     Status: Abnormal   Collection Time: 07/02/21  7:26 AM  Result Value Ref Range   Sodium 139 135 - 145 mmol/L   Potassium 3.4 (L) 3.5 - 5.1 mmol/L   Chloride 107 98 - 111 mmol/L   CO2 26 22 - 32 mmol/L   Glucose, Bld 115 (H) 70 - 99 mg/dL    Comment: Glucose reference range applies only to samples taken after fasting for at least 8 hours.   BUN 13 6 - 20 mg/dL   Creatinine, Ser 1.03 (H) 0.44 - 1.00 mg/dL   Calcium 8.6 (L) 8.9 - 10.3 mg/dL   Total Protein 6.8 6.5 - 8.1 g/dL   Albumin 3.7 3.5 - 5.0 g/dL   AST 23 15 - 41 U/L   ALT 24 0 - 44 U/L   Alkaline Phosphatase 49 38 - 126 U/L   Total Bilirubin 0.7 0.3 - 1.2 mg/dL   GFR, Estimated >60 >60 mL/min    Comment: (NOTE) Calculated using the CKD-EPI Creatinine Equation (2021)    Anion gap 6 5 - 15  Comment: Performed at [facility], [address] [place], [place] [address]  CBC     Status: Abnormal   Collection Time: 07/02/21  7:26 AM  Result Value Ref Range   WBC 5.8 4.0 - 10.5 K/uL   RBC 4.60 3.87 - 5.11 MIL/uL   Hemoglobin 11.9 (L) 12.0 - 15.0 g/dL   HCT 37.0 36.0 - 46.0 %   MCV 80.4 80.0 - 100.0 fL   MCH 25.9 (L) 26.0 - 34.0 pg   MCHC 32.2 30.0 - 36.0 g/dL   RDW 14.3 11.5 - 15.5 %   Platelets 307 150 - 400 K/uL   nRBC 0.0 0.0 - 0.2 %    Comment: Performed at [facility], [address] [place], [place] [address]    Imaging / Studies: US Abdomen Limited RUQ (LIVER/GB)  Result Date: 06/20/2021 CLINICAL  DATA:  Right upper quadrant pain EXAM: ULTRASOUND ABDOMEN LIMITED RIGHT UPPER QUADRANT COMPARISON:  None Available. FINDINGS: Gallbladder: Shadowing stones. Normal wall thickness. Negative sonographic Murphy. Common bile duct: Diameter: 6.6 mm Liver: No focal lesion identified. Within normal limits in parenchymal echogenicity. Portal vein is patent on color Doppler imaging with normal direction of blood flow towards the liver. Other: Right kidney cortex may be slightly echogenic. IMPRESSION: 1. Cholelithiasis without sonographic evidence for acute cholecystitis. Common bile duct upper normal, correlate with LFTs 2. Question slightly echogenic right kidney as may be seen with medical renal disease. Correlate with appropriate laboratory values. Electronically Signed   By: Donavan Foil M.D.   On: 06/20/2021 20:13    Medications / Allergies: per chart  Antibiotics: Anti-infectives (From admission, onward)    Start     Dose/Rate Route Frequency Ordered Stop   07/02/21 1030  cefTRIAXone (ROCEPHIN) 2 g in sodium chloride 0.9 % 100 mL IVPB       See Hyperspace for full Linked Orders Report.   2 g 200 mL/hr over 30 Minutes Intravenous On call to O.R. 07/02/21 1018 07/03/21 0559   07/02/21 1030  metroNIDAZOLE (FLAGYL) IVPB 500 mg       See Hyperspace for full Linked Orders Report.   500 mg 100 mL/hr over 60 Minutes Intravenous On call to O.R. 07/02/21 1018 07/03/21 0559         Note: Portions of this report may have been transcribed using voice recognition software. Every effort was made to ensure accuracy; however, inadvertent computerized transcription errors may be present.   Any transcriptional errors that result from this process are unintentional.    Adin Hector, MD, FACS, MASCRS Esophageal, Gastrointestinal & Colorectal Surgery Robotic and Minimally Invasive Surgery  [facility], [place]  [place] [place],  [place] [address] [phone] Fax [phone] Main  CONTACT INFORMATION:  Weekday (9AM-5PM): Call [facility] main office at [phone]  Weeknight (5PM-9AM) or Weekend/Holiday: Check [email or web address] (password " [email or web address]") for General Surgery [facility] coverage  (Please, do not use SecureChat as it is not reliable communication to operating surgeons for immediate patient care)       07/02/2021  10:22 AM

## 2021-07-02 NOTE — ED Provider Notes (Signed)
  Physical Exam  BP 135/89 (BP Location: Right Arm)   Pulse 66   Temp 98.1 F (36.7 C) (Oral)   Resp 18   Ht '5\' 4"'$  (1.626 m)   Wt 113.4 kg   LMP 06/25/2020   SpO2 100%   Breastfeeding No   BMI 42.91 kg/m   Physical Exam  Procedures  Procedures  ED Course / MDM    Medical Decision Making Amount and/or Complexity of Data Reviewed Labs: ordered.   Transfer from [place].  Right upper quadrant abdominal pain.  Known gallstones.  Continued pain.  Continues to be tender.  Sent to see Dr. Johney Maine and potentially ultrasound.  Lab work reviewed.  Will discuss with surgery.       Davonna Belling, MD 07/02/21 1517

## 2021-07-03 ENCOUNTER — Encounter (HOSPITAL_COMMUNITY): Payer: Self-pay | Admitting: Surgery

## 2021-07-03 NOTE — Plan of Care (Signed)

## 2021-07-03 NOTE — Discharge Summary (Signed)
Physician Discharge Summary    Patient ID: Kendra Guerrero MRN: [id number] DOB/AGE: 05/12/1985  36 y.o.  Patient Care Team: System, Provider Not In as PCP - General  Admit date: 07/02/2021  Discharge date: 07/03/2021  Hospital Stay = 0 days    Discharge Diagnoses:  Principal Problem:   Acute on chronic cholecystitis Active Problems:   BMI 40.0-44.9, adult [facility])   Attention deficit disorder predominant inattentive type   Prediabetes   Acute calculous cholecystitis   1 Day Post-Op  07/02/2021  POST-OPERATIVE DIAGNOSIS:   CHRONIC CHOLECYSTITIS  SURGERY:  07/02/2021  Procedure(s): LAPAROSCOPIC CHOLECYSTECTOMY SINGLE SITE;  WEDGE LIVER BIOPSY  SURGEON:    Surgeon(s): Michael Boston, MD  Consults: [facility] Course:   Patient with recurrent biliary The patient underwent the surgery above.  Postoperatively, the patient gradually mobilized and advanced to a solid diet.  Pain and other symptoms were treated aggressively.    By the time of discharge, the patient was walking well the hallways, eating food, having flatus.  Pain was well-controlled on an oral medications.  Based on meeting discharge criteria and continuing to recover, I felt it was safe for the patient to be discharged from the hospital to further recover with close followup. Postoperative recommendations were discussed in detail.  They are written as well.  Discharged Condition: GOOD  Discharge Exam: Blood pressure (!) 146/93, pulse 67, temperature 98.4 F (36.9 C), temperature source Oral, resp. rate 15, height '5\' 4"'$  (1.626 m), weight 113.4 kg, last menstrual period 06/25/2020, SpO2 100 %, not currently breastfeeding.  General: Pt awake/alert/oriented x4 in No acute distress Eyes: PERRL, normal EOM.  Sclera clear.  No icterus Neuro: CN II-XII intact w/o focal sensory/motor deficits. Lymph: No head/neck/groin lymphadenopathy Psych:  No delerium/psychosis/paranoia HENT: Normocephalic, Mucus  membranes moist.  No thrush Neck: Supple, No tracheal deviation Chest:  No chest wall pain w good excursion CV:  Pulses intact.  Regular rhythm MS: Normal AROM mjr joints.  No obvious deformity Abdomen: Soft.  Nondistended.  Mildly tender at incisions only.  No evidence of peritonitis.  No incarcerated hernias. Ext:  SCDs BLE.  No mjr edema.  No cyanosis Skin: No petechiae / purpura   Disposition:    Follow-up [facility]. Schedule an appointment as soon as possible for a visit in 3 week(s).   Specialty: General Surgery Why: Call [facility] surgery office Monday morning 5/22 to set up postoperative  appointment.  "[facility] clinic" Contact information: [address] [place] [phone]                Discharge disposition: 01-Home or Self Care       Discharge Instructions     Call MD for:   Complete by: As directed    FEVER > 101.5 F  (temperatures < 101.5 F are not significant)   Call MD for:  extreme fatigue   Complete by: As directed    Call MD for:  persistant dizziness or light-headedness   Complete by: As directed    Call MD for:  persistant nausea and vomiting   Complete by: As directed    Call MD for:  redness, tenderness, or signs of infection (pain, swelling, redness, odor or green/yellow discharge around incision site)   Complete by: As directed    Call MD for:  severe uncontrolled pain   Complete by: As directed    Diet - low sodium heart healthy  Complete by: As directed    Start with a bland diet such as soups, liquids, starchy foods, low fat foods, etc. the first few days at home. Gradually advance to a solid, low-fat, high fiber diet by the end of the first week at home.   Add a fiber supplement to your diet (Metamucil, etc) If you feel full, bloated, or constipated, stay on a full liquid or pureed/blenderized diet for a few days until you feel better and are no longer  constipated.   Discharge instructions   Complete by: As directed    See Discharge Instructions If you are not getting better after two weeks or are noticing you are getting worse, contact our office [phone] for further advice.  We may need to adjust your medications, re-evaluate you in the office, send you to the emergency room, or see what other things we can do to help. The clinic staff is available to answer your questions during regular business hours (8:30am-5pm).  Please don't hesitate to call and ask to speak to one of our nurses for clinical concerns.    A surgeon from [facility] is always on call at the hospitals 24 hours/day If you have a medical emergency, go to the nearest emergency room or call 911.   Discharge wound care:   Complete by: As directed    It is good for closed incisions and even open wounds to be washed every day.  Shower every day.  Short baths are fine.  Wash the incisions and wounds clean with soap & water.    You may leave closed incisions open to air if it is dry.   You may cover the incision with clean gauze & replace it after your daily shower for comfort.  TEGADERM:  You have clear gauze band-aid dressings over your closed incision(s).  Remove the dressings 3 days after surgery.   Driving Restrictions   Complete by: As directed    You may drive when: - you are no longer taking narcotic prescription pain medication - you can comfortably wear a seatbelt - you can safely make sudden turns/stops without pain.   Increase activity slowly   Complete by: As directed    Start light daily activities --- self-care, walking, climbing stairs- beginning the day after surgery.  Gradually increase activities as tolerated.  Control your pain to be active.  Stop when you are tired.  Ideally, walk several times a day, eventually an hour a day.   Most people are back to most day-to-day activities in a few weeks.  It takes 4-6 weeks to get back to  unrestricted, intense activity. If you can walk 30 minutes without difficulty, it is safe to try more intense activity such as jogging, treadmill, bicycling, low-impact aerobics, swimming, etc. Save the most intensive and strenuous activity for last (Usually 4-8 weeks after surgery) such as sit-ups, heavy lifting, contact sports, etc.  Refrain from any intense heavy lifting or straining until you are off narcotics for pain control.  You will have off days, but things should improve week-by-week. DO NOT PUSH THROUGH PAIN.  Let pain be your guide: If it hurts to do something, don't do it.   Lifting restrictions   Complete by: As directed    If you can walk 30 minutes without difficulty, it is safe to try more intense activity such as jogging, treadmill, bicycling, low-impact aerobics, swimming, etc. Save the most intensive and strenuous activity for last (Usually 4-8 weeks after surgery)  such as sit-ups, heavy lifting, contact sports, etc.   Refrain from any intense heavy lifting or straining until you are off narcotics for pain control.  You will have off days, but things should improve week-by-week. DO NOT PUSH THROUGH PAIN.  Let pain be your guide: If it hurts to do something, don't do it.  Pain is your body warning you to avoid that activity for another week until the pain goes down.   May shower / Bathe   Complete by: As directed    May walk up steps   Complete by: As directed    Remove dressing in 72 hours   Complete by: As directed    Make sure all dressings are removed by the third day after surgery.  Leave incisions open to air.  OK to cover incisions with gauze or bandages as desired   Sexual Activity Restrictions   Complete by: As directed    You may have sexual intercourse when it is comfortable. If it hurts to do something, stop.       Allergies as of 07/03/2021       Reactions   Apple Juice Other (See Comments)   Reaction:  Unknown    Apple    Codeine Nausea And Vomiting         Medication List     TAKE these medications    ibuprofen 600 MG tablet Commonly known as: ADVIL Take 1 tablet (600 mg total) by mouth every 6 (six) hours.   traMADol 50 MG tablet Commonly known as: ULTRAM Take 1-2 tablets (50-100 mg total) by mouth every 6 (six) hours as needed for moderate pain or severe pain.               Discharge Care Instructions  (From admission, onward)           Start     Ordered   07/02/21 0000  Discharge wound care:       Comments: It is good for closed incisions and even open wounds to be washed every day.  Shower every day.  Short baths are fine.  Wash the incisions and wounds clean with soap & water.    You may leave closed incisions open to air if it is dry.   You may cover the incision with clean gauze & replace it after your daily shower for comfort.  TEGADERM:  You have clear gauze band-aid dressings over your closed incision(s).  Remove the dressings 3 days after surgery.   07/02/21 1237            Significant Diagnostic Studies:  Results for orders placed or performed during the hospital encounter of 07/02/21 (from the past 72 hour(s))  Urinalysis, Routine w reflex microscopic Urine, Clean Catch     Status: None   Collection Time: 07/02/21  7:05 AM  Result Value Ref Range   Color, Urine YELLOW YELLOW   APPearance CLEAR CLEAR   Specific Gravity, Urine 1.020 1.005 - 1.030   pH 6.5 5.0 - 8.0   Glucose, UA NEGATIVE NEGATIVE mg/dL   Hgb urine dipstick NEGATIVE NEGATIVE   Bilirubin Urine NEGATIVE NEGATIVE   Ketones, ur NEGATIVE NEGATIVE mg/dL   Protein, ur NEGATIVE NEGATIVE mg/dL   Nitrite NEGATIVE NEGATIVE   Leukocytes,Ua NEGATIVE NEGATIVE    Comment: Microscopic not done on urines with negative protein, blood, leukocytes, nitrite, or glucose < 500 mg/dL. Performed at [facility], [address], [place],  [address]  Pregnancy, urine     Status: None   Collection Time: 07/02/21  7:05 AM  Result  Value Ref Range   Preg Test, Ur NEGATIVE NEGATIVE    Comment:        THE SENSITIVITY OF THIS METHODOLOGY IS >20 mIU/mL. Performed at [facility], [address] [place], [place] [address]   Lipase, blood     Status: None   Collection Time: 07/02/21  7:26 AM  Result Value Ref Range   Lipase 28 11 - 51 U/L    Comment: Performed at [facility], [address] [place], [place] [address]  Comprehensive metabolic panel     Status: Abnormal   Collection Time: 07/02/21  7:26 AM  Result Value Ref Range   Sodium 139 135 - 145 mmol/L   Potassium 3.4 (L) 3.5 - 5.1 mmol/L   Chloride 107 98 - 111 mmol/L   CO2 26 22 - 32 mmol/L   Glucose, Bld 115 (H) 70 - 99 mg/dL    Comment: Glucose reference range applies only to samples taken after fasting for at least 8 hours.   BUN 13 6 - 20 mg/dL   Creatinine, Ser 1.03 (H) 0.44 - 1.00 mg/dL   Calcium 8.6 (L) 8.9 - 10.3 mg/dL   Total Protein 6.8 6.5 - 8.1 g/dL   Albumin 3.7 3.5 - 5.0 g/dL   AST 23 15 - 41 U/L   ALT 24 0 - 44 U/L   Alkaline Phosphatase 49 38 - 126 U/L   Total Bilirubin 0.7 0.3 - 1.2 mg/dL   GFR, Estimated >60 >60 mL/min    Comment: (NOTE) Calculated using the CKD-EPI Creatinine Equation (2021)    Anion gap 6 5 - 15    Comment: Performed at [facility], [address] [place], [place] [address]  CBC     Status: Abnormal   Collection Time: 07/02/21  7:26 AM  Result Value Ref Range   WBC 5.8 4.0 - 10.5 K/uL   RBC 4.60 3.87 - 5.11 MIL/uL   Hemoglobin 11.9 (L) 12.0 - 15.0 g/dL   HCT 37.0 36.0 - 46.0 %   MCV 80.4 80.0 - 100.0 fL   MCH 25.9 (L) 26.0 - 34.0 pg   MCHC 32.2 30.0 - 36.0 g/dL   RDW 14.3 11.5 - 15.5 %   Platelets 307 150 - 400 K/uL   nRBC 0.0 0.0 - 0.2 %    Comment: Performed at [facility], [address] [place], [place] [address]    DG C-Arm 1-60 Min-No Report  Result Date: 07/02/2021 Fluoroscopy was utilized by the requesting physician.  No radiographic  interpretation.    Past Medical History:  Diagnosis Date   C. difficile diarrhea 02/13/2009   Chlamydia    Clostridium difficile infection 02/13/2009   Fibroid    Hx of pyelonephritis    UTI (urinary tract infection)    Vaginal Pap smear, abnormal     Past Surgical History:  Procedure Laterality Date   CHOLECYSTECTOMY N/A 07/02/2021   Procedure: LAPAROSCOPIC CHOLECYSTECTOMY SINGLE SITE;  WEDGE LIVER BIOPSY;  Surgeon: Michael Boston, MD;  Location: [facility];  Service: General;  Laterality: N/A;   CYSTECTOMY     back of neck   DILATION AND CURETTAGE OF UTERUS      Social History   Socioeconomic History   Marital status: Single    Spouse name: Not on file   Number of  children: Not on file   Years of education: Not on file   Highest education level: Not on file  Occupational History   Not on file  Tobacco Use   Smoking status: Never   Smokeless tobacco: Never  Vaping Use   Vaping Use: Some days  Substance and Sexual Activity   Alcohol use: Yes    Comment: occasional but not while pregnant   Drug use: No   Sexual activity: Yes    Birth control/protection: Pill, None    Comment: had been taking birth controll pills  Other Topics Concern   Not on file  Social History Narrative   Not on file   Social Determinants of Health   Financial Resource Strain: Not on file  Food Insecurity: No Food Insecurity   Worried About Running Out of Food in the Last Year: Never true   Ran Out of Food in the Last Year: Never true  Transportation Needs: No Transportation Needs   Lack of Transportation (Medical): No   Lack of Transportation (Non-Medical): No  Physical Activity: Not on file  Stress: Not on file  Social Connections: Not on file  Intimate Partner Violence: Not on file    Family History  Problem Relation Age of Onset   Diabetes Mother    Stroke Mother    Healthy Father    Hypertension Maternal Grandmother    Anesthesia problems Neg Hx    Hypotension Neg Hx     Malignant hyperthermia Neg Hx    Pseudochol deficiency Neg Hx     Current Facility-Administered Medications  Medication Dose Route Frequency Provider Last Rate Last Admin   0.9 %  sodium chloride infusion  250 mL Intravenous PRN Michael Boston, MD       acetaminophen (TYLENOL) tablet 1,000 mg  1,000 mg Oral Q6H Michael Boston, MD   1,000 mg at 07/03/21 0431   bisacodyl (DULCOLAX) suppository 10 mg  10 mg Rectal Daily PRN Michael Boston, MD       diphenhydrAMINE (BENADRYL) 12.5 MG/5ML elixir 12.5 mg  12.5 mg Oral Q6H PRN Michael Boston, MD       Or   diphenhydrAMINE (BENADRYL) injection 12.5 mg  12.5 mg Intravenous Q6H PRN Michael Boston, MD       enoxaparin (LOVENOX) injection 40 mg  40 mg Subcutaneous Q24H Michael Boston, MD   40 mg at 07/03/21 0735   gabapentin (NEURONTIN) capsule 300 mg  300 mg Oral BID Michael Boston, MD   300 mg at 07/03/21 0916   HYDROmorphone (DILAUDID) injection 0.5-1 mg  0.5-1 mg Intravenous Q3H PRN Michael Boston, MD   1 mg at 07/02/21 1832   lactated ringers bolus 1,000 mL  1,000 mL Intravenous Q8H PRN Michael Boston, MD       lip balm (CARMEX) ointment 1 application.  1 application. Topical BID Michael Boston, MD   1 application. at 07/03/21 3790   magic mouthwash  15 mL Oral QID PRN Michael Boston, MD       magnesium hydroxide (MILK OF MAGNESIA) suspension 30 mL  30 mL Oral Daily PRN Michael Boston, MD       methocarbamol (ROBAXIN) 1,000 mg in dextrose 5 % 100 mL IVPB  1,000 mg Intravenous Q6H PRN Michael Boston, MD       methocarbamol (ROBAXIN) tablet 500 mg  500 mg Oral Q6H PRN Michael Boston, MD   500 mg at 07/03/21 0031   metoprolol tartrate (LOPRESSOR) injection 5 mg  5 mg Intravenous Q6H PRN  Michael Boston, MD       ondansetron (ZOFRAN-ODT) disintegrating tablet 4 mg  4 mg Oral Q6H PRN Michael Boston, MD       Or   ondansetron [facility]) injection 4 mg  4 mg Intravenous Q6H PRN Michael Boston, MD       oxyCODONE (Oxy IR/ROXICODONE) immediate release tablet 5-10 mg  5-10 mg  Oral Q4H PRN Michael Boston, MD   10 mg at 07/03/21 0431   polycarbophil (FIBERCON) tablet 625 mg  625 mg Oral BID Michael Boston, MD   625 mg at 07/03/21 0915   prochlorperazine (COMPAZINE) tablet 10 mg  10 mg Oral Q6H PRN Michael Boston, MD       Or   prochlorperazine (COMPAZINE) injection 5-10 mg  5-10 mg Intravenous Q6H PRN Michael Boston, MD       simethicone (MYLICON) chewable tablet 40 mg  40 mg Oral Q6H PRN Michael Boston, MD       sodium chloride flush (NS) 0.9 % injection 3 mL  3 mL Intravenous Gorden Harms, MD   3 mL at 07/02/21 2200   sodium chloride flush (NS) 0.9 % injection 3 mL  3 mL Intravenous PRN Michael Boston, MD       traMADol Veatrice Bourbon) tablet 50-100 mg  50-100 mg Oral Q6H PRN Michael Boston, MD   100 mg at 07/03/21 0740     Allergies  Allergen Reactions   Apple Juice Other (See Comments)    Reaction:  Unknown    Apple    Codeine Nausea And Vomiting    Signed: Morton Peters, MD, FACS, MASCRS Esophageal, Gastrointestinal & Colorectal Surgery Robotic and Minimally Invasive Surgery  [facility], [place]. [address] [place], [place] [address] [phone] Fax [phone] [address]  CONTACT INFORMATION:  Weekday (9AM-5PM): Call [facility] main office at [phone]  Weeknight (5PM-9AM) or Weekend/Holiday: Check [email or web address] (password " [email or web address]") for General Surgery [facility] coverage  (Please, do not use SecureChat as it is not reliable communication to operating surgeons for immediate patient care)      07/03/2021, 9:34 AM

## 2021-07-05 LAB — SURGICAL PATHOLOGY

## 2021-07-18 ENCOUNTER — Telehealth: Payer: Self-pay | Admitting: Clinical

## 2021-07-18 NOTE — Telephone Encounter (Signed)
Attempt call regarding referral; Left HIPPA-compliant message to call back Lillyanne Bradburn from [facility] at  [phone] (Aarish Rockers's office).    

## 2021-08-29 DIAGNOSIS — M654 Radial styloid tenosynovitis [de Quervain]: Secondary | ICD-10-CM | POA: Diagnosis not present

## 2021-09-22 ENCOUNTER — Encounter: Payer: Self-pay | Admitting: Obstetrics and Gynecology

## 2021-09-22 ENCOUNTER — Ambulatory Visit (INDEPENDENT_AMBULATORY_CARE_PROVIDER_SITE_OTHER): Payer: Medicaid Other | Admitting: Obstetrics and Gynecology

## 2021-09-22 ENCOUNTER — Other Ambulatory Visit: Payer: Self-pay

## 2021-09-22 DIAGNOSIS — Z975 Presence of (intrauterine) contraceptive device: Secondary | ICD-10-CM | POA: Diagnosis not present

## 2021-09-22 DIAGNOSIS — N938 Other specified abnormal uterine and vaginal bleeding: Secondary | ICD-10-CM

## 2021-09-22 MED ORDER — MEGESTROL ACETATE 40 MG PO TABS: 40.0000 mg | ORAL_TABLET | Freq: Two times a day (BID) | ORAL | 5 refills | Status: AC

## 2021-09-22 NOTE — Progress Notes (Signed)
Kendra Guerrero presents with c/o [place] Cycles have been irregular since delivery 5 months ago Has Implanon in place Current cycle started 3 weeks ago, heavy with cramps and clots History of fibroids  PE AF VSS Chaperone present  Lungs clear Heart RRR Abd soft + BS GU NL EGBUS, menses noted, uterus slightly tender @ 8 week size  A/P DUB  Reviewed with pt. Will check GYN U/S and labs. Start Megace. F/U in 4 weeks

## 2021-09-22 NOTE — Progress Notes (Signed)
Pt reports that she has been bleeding for 3 weeks now, wearing depends..passed large blood clots yesterday. Has Fibroids.

## 2021-09-22 NOTE — Progress Notes (Signed)
U/S appt scheduled on 09/29/21@ 3:30p, arrive at 3p.

## 2021-09-22 NOTE — Patient Instructions (Signed)
Abnormal Uterine Bleeding Abnormal uterine bleeding means bleeding more than normal from your womb (uterus). It can include: Bleeding after sex. Bleeding between monthly (menstrual) periods. Bleeding that is heavier than normal. Monthly periods that last longer than normal. Bleeding after you have stopped having your monthly period (menopause). You should see a doctor for any kind of bleeding that is not normal. Treatment depends on the cause of your bleeding and how much you bleed. Follow these instructions at home: Medicines Take over-the-counter and prescription medicines only as told by your doctor. Ask your doctor about: Taking medicines such as aspirin and ibuprofen. Do not take these medicines unless your doctor tells you to take them. Taking over-the-counter medicines, vitamins, herbs, and supplements. You may be given iron pills. Take them as told by your doctor. Managing constipation If you take iron pills, you may need to take these actions to prevent or treat trouble pooping (constipation): Drink enough fluid to keep your pee (urine) pale yellow. Take over-the-counter or prescription medicines. Eat foods that are high in fiber. These include beans, whole grains, and fresh fruits and vegetables. Limit foods that are high in fat and sugar. These include fried or sweet foods. Activity Change your activity to decrease bleeding if you need to change your sanitary pad more than one time every 2 hours: Lie in bed with your feet raised (elevated). Place a cold pack on your lower belly. Rest as much as you are able until the bleeding stops or slows down. General instructions Do not use tampons, douche, or have sex until your doctor says these things are okay. Change your pads often. Get regular exams. These include: Pelvic exams. Screenings for cancer of the cervix. It is up to you to get the results of any tests that are done. Ask how to get your results when they are  ready. Watch for any changes in your bleeding. For 2 months, write down: When your monthly period starts. When your monthly period ends. When you get any abnormal bleeding from your vagina. What problems you notice. Keep all follow-up visits. Contact a doctor if: The bleeding lasts more than one week. You feel dizzy at times. You feel like you may vomit (nausea). You vomit. You feel light-headed or weak. Your symptoms get worse. Get help right away if: You faint. You have to change pads every hour. You have pain in your belly. You have a fever or chills. You get sweaty or weak. You pass large blood clots from your vagina. These symptoms may be an emergency. Get help right away. Call your local emergency services (911 in the [place] Do not wait to see if the symptoms will go away. Do not drive yourself to the hospital. Summary Abnormal uterine bleeding means bleeding more than normal from your womb (uterus). Any kind of bleeding that is not normal should be checked by a doctor. Treatment depends on the cause of your bleeding and how much you bleed. Get help right away if you faint, you have to change pads every hour, or you pass large blood clots from your vagina. This information is not intended to replace advice given to you by your health care provider. Make sure you discuss any questions you have with your health care provider. Document Revised: 06/01/2020 Document Reviewed: 06/01/2020 Elsevier Patient Education  2023 [employer]  

## 2021-09-23 LAB — CBC
Hematocrit: 40.4 % (ref 34.0–46.6)
Hemoglobin: 13.1 g/dL (ref 11.1–15.9)
MCH: 25.6 pg — ABNORMAL LOW (ref 26.6–33.0)
MCHC: 32.4 g/dL (ref 31.5–35.7)
MCV: 79 fL (ref 79–97)
Platelets: 307 10*3/uL (ref 150–450)
RBC: 5.11 x10E6/uL (ref 3.77–5.28)
RDW: 13.6 % (ref 11.7–15.4)
WBC: 4.2 10*3/uL (ref 3.4–10.8)

## 2021-09-23 LAB — TSH: TSH: 1.91 u[IU]/mL (ref 0.450–4.500)

## 2021-09-29 ENCOUNTER — Encounter: Payer: Self-pay | Admitting: Obstetrics and Gynecology

## 2021-09-29 ENCOUNTER — Ambulatory Visit (HOSPITAL_COMMUNITY)
Admission: RE | Admit: 2021-09-29 | Discharge: 2021-09-29 | Disposition: A | Discharge status: Home / Self Care | Payer: Medicaid Other | Source: Ambulatory Visit | Attending: Obstetrics and Gynecology | Admitting: Obstetrics and Gynecology

## 2021-09-29 DIAGNOSIS — N938 Other specified abnormal uterine and vaginal bleeding: Secondary | ICD-10-CM | POA: Diagnosis not present

## 2021-09-29 DIAGNOSIS — D252 Subserosal leiomyoma of uterus: Secondary | ICD-10-CM | POA: Diagnosis not present

## 2021-09-29 DIAGNOSIS — N83202 Unspecified ovarian cyst, left side: Secondary | ICD-10-CM | POA: Diagnosis not present

## 2021-09-29 DIAGNOSIS — R9389 Abnormal findings on diagnostic imaging of other specified body structures: Secondary | ICD-10-CM | POA: Diagnosis not present

## 2021-09-29 DIAGNOSIS — D251 Intramural leiomyoma of uterus: Secondary | ICD-10-CM | POA: Diagnosis not present

## 2021-10-20 ENCOUNTER — Encounter: Payer: Self-pay | Admitting: Obstetrics and Gynecology

## 2021-10-20 ENCOUNTER — Other Ambulatory Visit: Payer: Self-pay

## 2021-10-20 ENCOUNTER — Ambulatory Visit (INDEPENDENT_AMBULATORY_CARE_PROVIDER_SITE_OTHER): Payer: Medicaid Other | Admitting: Obstetrics and Gynecology

## 2021-10-20 VITALS — BP 160/147 | HR 98 | Ht 64.0 in | Wt 247.7 lb

## 2021-10-20 DIAGNOSIS — Z975 Presence of (intrauterine) contraceptive device: Secondary | ICD-10-CM

## 2021-10-20 DIAGNOSIS — D219 Benign neoplasm of connective and other soft tissue, unspecified: Secondary | ICD-10-CM

## 2021-10-20 DIAGNOSIS — N938 Other specified abnormal uterine and vaginal bleeding: Secondary | ICD-10-CM | POA: Diagnosis not present

## 2021-10-20 MED ORDER — TRANEXAMIC ACID 650 MG PO TABS: 1300.0000 mg | ORAL_TABLET | Freq: Three times a day (TID) | ORAL | 2 refills | Status: AC

## 2021-10-20 MED ORDER — DESOGESTREL-ETHINYL ESTRADIOL 0.15-30 MG-MCG PO TABS
1.0000 | ORAL_TABLET | Freq: Every day | ORAL | 11 refills | Status: DC
Start: 2021-10-20 — End: 2023-07-12

## 2021-10-20 NOTE — Patient Instructions (Signed)
Uterine Fibroids  Uterine fibroids are lumps of tissue (tumors) in the womb (uterus). Fibroids are not cancerous. Most women with this condition do not need treatment. Sometimes, fibroids can make it harder to have children. If this happens, you may need surgery to take out the fibroids. What are the causes? The cause of this condition is not known. What increases the risk? You are in your 30s or 40s and have not gone through menopause. Menopause is when you have not had a menstrual period for 12 months. Having a history of fibroids in your family. You are of African American descent. You started your period at age 10 or younger. You have not given birth. You are overweight or very overweight. What are the signs or symptoms? Bleeding between menstrual periods. Heavy bleeding during your menstrual period. Pain in the area between your hips. Needing to pee (urinate) right away or more often than usual. Not being able to have children (infertility). Not being able to stay pregnant (miscarriage). Many women do not have symptoms.  How is this treated? Treatment may include: Follow-up visits with your doctor to check your fibroids for any changes. Medicines to help with pain, such as aspirin or ibuprofen. Hormone therapy. This may be given as a pill, in a shot, or with a type of birth control device called an IUD. Surgery that would do one of these things: Take out the fibroids. This may be done if you want to become pregnant. Take out the womb (hysterectomy). Stop the blood flow to the fibroids. Follow these instructions at home: Medicines Take over-the-counter and prescription medicines only as told by your doctor. Ask your doctor if you should: Take iron pills. Eat more foods that have a lot of iron in them, such as dark green, leafy vegetables. Managing pain If told, put heat on your back or belly. Do this as often as told by your doctor. Use the heat source that your doctor  recommends, such as a moist heat pack or a heating pad. To do this: Put a towel between your skin and the heat pack or pad. Leave the heat on for 20-30 minutes. Take off the heat if your skin turns bright red. This is very important. If you cannot feel pain, heat, or cold, you may have a greater risk of getting burned.  General instructions Tell your doctor about any changes to your menstrual period, such as: Heavy bleeding that needs a change of tampons or pads more than normal. A change in how many days your period lasts. A change in symptoms that come with your period. This might be belly cramps or back pain. Keep all follow-up visits. Contact a doctor if: You have pain that does not get better with medicine or heat. This may include pain or cramps in: The area between your hip bones. Your back. Your belly. You have new bleeding between your periods. You have more bleeding during or between your periods. You feel very tired or weak. You feel dizzy. Get help right away if: You faint. You have pain in the area between your hip bones that gets worse. You have bleeding that soaks a tampon or pad in 30 minutes or less. Summary Uterine fibroids are lumps of tissue (tumors) in your womb. They are not cancerous. Medicines such as aspirin or ibuprofen may be used to help with pain. Contact a doctor if you have pain or cramps that do not get better with medicine. Know the symptoms for when you   should get help right away. This information is not intended to replace advice given to you by your health care provider. Make sure you discuss any questions you have with your health care provider. Document Revised: 09/02/2019 Document Reviewed: 09/02/2019 Elsevier Patient Education  2023 [employer]  

## 2021-10-20 NOTE — Progress Notes (Signed)
Pt reports that she is still bleeding.

## 2021-10-21 NOTE — Progress Notes (Signed)
Kendra Guerrero presents for follow up of her DUB in setting of Implanon. See prior office notes No significant change with Megace  GYN U/S uterine fibroids Reviewed with pt  PE AF VSS Lungs clear Heart RRR Abd soft + BS  A/P DUB in setting of Implanon        Uterine fibroids  Tx options reviewed with pt. Will start OCP's. U/R/B reviewed. Will schedule appt for removal of Implanon. Lysteda as well. F/U in 3 months

## 2021-11-10 ENCOUNTER — Ambulatory Visit: Payer: Medicaid Other | Admitting: Obstetrics and Gynecology

## 2021-12-09 ENCOUNTER — Ambulatory Visit: Payer: Medicaid Other | Admitting: Obstetrics and Gynecology

## 2022-06-10 ENCOUNTER — Emergency Department (HOSPITAL_BASED_OUTPATIENT_CLINIC_OR_DEPARTMENT_OTHER): Payer: Medicaid Other

## 2022-06-10 ENCOUNTER — Other Ambulatory Visit: Payer: Self-pay

## 2022-06-10 ENCOUNTER — Encounter (HOSPITAL_BASED_OUTPATIENT_CLINIC_OR_DEPARTMENT_OTHER): Payer: Self-pay | Admitting: Emergency Medicine

## 2022-06-10 ENCOUNTER — Emergency Department (HOSPITAL_BASED_OUTPATIENT_CLINIC_OR_DEPARTMENT_OTHER)
Admission: EM | Admit: 2022-06-10 | Discharge: 2022-06-11 | Disposition: A | Discharge status: Home / Self Care | Payer: Medicaid Other | Attending: Emergency Medicine | Admitting: Emergency Medicine

## 2022-06-10 DIAGNOSIS — R1084 Generalized abdominal pain: Secondary | ICD-10-CM | POA: Insufficient documentation

## 2022-06-10 DIAGNOSIS — R079 Chest pain, unspecified: Secondary | ICD-10-CM

## 2022-06-10 DIAGNOSIS — R0789 Other chest pain: Secondary | ICD-10-CM | POA: Diagnosis present

## 2022-06-10 DIAGNOSIS — M79662 Pain in left lower leg: Secondary | ICD-10-CM | POA: Diagnosis not present

## 2022-06-10 LAB — PREGNANCY, URINE: Preg Test, Ur: NEGATIVE

## 2022-06-10 LAB — CBC WITH DIFFERENTIAL/PLATELET
Abs Immature Granulocytes: 0.01 10*3/uL (ref 0.00–0.07)
Basophils Absolute: 0 10*3/uL (ref 0.0–0.1)
Basophils Relative: 0 %
Eosinophils Absolute: 0.1 10*3/uL (ref 0.0–0.5)
Eosinophils Relative: 3 %
HCT: 33.9 % — ABNORMAL LOW (ref 36.0–46.0)
Hemoglobin: 10.7 g/dL — ABNORMAL LOW (ref 12.0–15.0)
Immature Granulocytes: 0 %
Lymphocytes Relative: 25 %
Lymphs Abs: 1.3 10*3/uL (ref 0.7–4.0)
MCH: 25.4 pg — ABNORMAL LOW (ref 26.0–34.0)
MCHC: 31.6 g/dL (ref 30.0–36.0)
MCV: 80.3 fL (ref 80.0–100.0)
Monocytes Absolute: 0.5 10*3/uL (ref 0.1–1.0)
Monocytes Relative: 9 %
Neutro Abs: 3.1 10*3/uL (ref 1.7–7.7)
Neutrophils Relative %: 63 %
Platelets: 368 10*3/uL (ref 150–400)
RBC: 4.22 MIL/uL (ref 3.87–5.11)
RDW: 13.3 % (ref 11.5–15.5)
WBC: 5 10*3/uL (ref 4.0–10.5)
nRBC: 0 % (ref 0.0–0.2)

## 2022-06-10 LAB — COMPREHENSIVE METABOLIC PANEL
ALT: 70 U/L — ABNORMAL HIGH (ref 0–44)
AST: 64 U/L — ABNORMAL HIGH (ref 15–41)
Albumin: 3.3 g/dL — ABNORMAL LOW (ref 3.5–5.0)
Alkaline Phosphatase: 108 U/L (ref 38–126)
Anion gap: 7 (ref 5–15)
BUN: 13 mg/dL (ref 6–20)
CO2: 25 mmol/L (ref 22–32)
Calcium: 8.6 mg/dL — ABNORMAL LOW (ref 8.9–10.3)
Chloride: 103 mmol/L (ref 98–111)
Creatinine, Ser: 0.82 mg/dL (ref 0.44–1.00)
GFR, Estimated: >60 mL/min (ref >60)
Glucose, Bld: 108 mg/dL — ABNORMAL HIGH (ref 70–99)
Potassium: 3.8 mmol/L (ref 3.5–5.1)
Sodium: 135 mmol/L (ref 135–145)
Total Bilirubin: 0.5 mg/dL (ref 0.3–1.2)
Total Protein: 6.9 g/dL (ref 6.5–8.1)

## 2022-06-10 LAB — TROPONIN I (HIGH SENSITIVITY): Troponin I (High Sensitivity): 3 ng/L (ref <18)

## 2022-06-10 LAB — D-DIMER, QUANTITATIVE: D-Dimer, Quant: 0.64 ug/mL-FEU — ABNORMAL HIGH (ref 0.00–0.50)

## 2022-06-10 LAB — LIPASE, BLOOD: Lipase: 55 U/L — ABNORMAL HIGH (ref 11–51)

## 2022-06-10 MED ORDER — OXYCODONE-ACETAMINOPHEN 5-325 MG PO TABS
1.0000 | ORAL_TABLET | Freq: Once | ORAL | Status: AC
  Administered 2022-06-11: 1 via ORAL
  Filled 2022-06-10: qty 1

## 2022-06-10 MED ORDER — ALUM & MAG HYDROXIDE-SIMETH 200-200-20 MG/5ML PO SUSP
30.0000 mL | Freq: Once | ORAL | Status: AC
  Administered 2022-06-10: 30 mL via ORAL
  Filled 2022-06-10: qty 30

## 2022-06-10 MED ORDER — LIDOCAINE VISCOUS HCL 2 % MT SOLN
15.0000 mL | Freq: Once | OROMUCOSAL | Status: AC
  Administered 2022-06-10: 15 mL via ORAL
  Filled 2022-06-10: qty 15

## 2022-06-10 MED ORDER — IOHEXOL 350 MG/ML SOLN
75.0000 mL | Freq: Once | INTRAVENOUS | Status: AC | PRN
  Administered 2022-06-10: 75 mL via INTRAVENOUS

## 2022-06-10 NOTE — ED Provider Notes (Signed)
EMERGENCY DEPARTMENT AT [facility] Provider Note  CSN: [id number] Arrival date & time: 06/10/22 4098  Chief Complaint(s) Chest Pain  HPI Kendra Guerrero is a 37 y.o. female with history of obesity presenting to the emergency department with lower chest and upper epigastric pain.  She reports it is pleuritic.  She also reports some pain to her left lower extremity.  No lightheadedness or dizziness.  No fevers or chills.  No syncope.  No nausea or vomiting.  She reports she has been taking ibuprofen without significant improvement.  No cough.  She denies shortness of breath but reports that the pain is worse with deep breathing.  She does report family history of blood clots.  No recent travel or surgeries.  Symptoms are mild.  Symptoms have been present for around 1 month.  She also has a sore to her inner lower lip which she is concerned could be related.   Past Medical History Past Medical History:  Diagnosis Date   C. 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 Authorizing Provider   hydrochlorothiazide (HYDRODIURIL) 25 MG tablet Take 1 tablet (25 mg total) by mouth daily. 06/11/22  Yes Delo, Riley Lam, MD  ibuprofen (ADVIL) 600 MG tablet Take 1 tablet (600 mg total) by mouth every 6 (six) hours as needed. 06/11/22  Yes Delo, Riley Lam, MD  desogestrel-ethinyl estradiol (APRI) 0.15-30 MG-MCG tablet Take 1 tablet by mouth daily. 10/20/21 10/20/22  Hermina Staggers, MD  megestrol (MEGACE) 40 MG tablet Take 1 tablet (40 mg total) by mouth 2 (two) times daily. Can increase to two tablets twice a day in the event of heavy bleeding 09/22/21   Hermina Staggers, MD  tranexamic acid (LYSTEDA) 650 MG TABS tablet Take 2 tablets (1,300 mg total) by mouth 3 (three) times daily. Take during menses for a maximum of five days 10/20/21   Hermina Staggers, MD  Past Surgical History Past Surgical History:  Procedure Laterality Date   CHOLECYSTECTOMY N/A 07/02/2021   Procedure: LAPAROSCOPIC CHOLECYSTECTOMY SINGLE SITE;  WEDGE LIVER BIOPSY;  Surgeon: Karie Soda, MD;  Location: [facility];  Service: General;  Laterality: N/A;   CYSTECTOMY     back of neck   DILATION AND CURETTAGE OF UTERUS     Family History Family History  Problem Relation Age of Onset   Diabetes Mother    Stroke Mother    Healthy Father    Hypertension Maternal Grandmother    Anesthesia problems Neg Hx    Hypotension Neg Hx    Malignant hyperthermia Neg Hx    Pseudochol deficiency Neg Hx     Social History Social History   Tobacco Use   Smoking status: Never   Smokeless tobacco: Never  Vaping Use   Vaping Use: Some days  Substance Use Topics   Alcohol use: Yes    Comment: occasional but not while pregnant   Drug use: No   Allergies Apple juice, Apple, and Codeine  Review of Systems Review of Systems  All other systems reviewed and are negative.   Physical Exam Vital Signs  I have  reviewed the triage vital signs BP 129/83   Pulse 70   Temp 98.9 F (37.2 C)   Resp 16   Ht 5\' 4"  (1.626 m)   Wt 108.9 kg   LMP 05/29/2022 Comment: nexplanon  SpO2 98%   BMI 41.20 kg/m  Physical Exam Vitals and nursing note reviewed.  Constitutional:      General: She is not in acute distress.    Appearance: She is well-developed.  HENT:     Head: Normocephalic and atraumatic.     Mouth/Throat:     Mouth: Mucous membranes are moist.  Eyes:     Pupils: Pupils are equal, round, and reactive to light.  Cardiovascular:     Rate and Rhythm: Normal rate and regular rhythm.     Heart sounds: No murmur heard. Pulmonary:     Effort: Pulmonary effort is normal. No respiratory distress.     Breath sounds: Normal breath sounds.  Abdominal:     General: Abdomen is flat.     Palpations: Abdomen is soft.     Tenderness: There is no abdominal tenderness.  Musculoskeletal:     Right lower leg: No tenderness. No edema.     Left lower leg: No tenderness. No edema.  Skin:    General: Skin is warm and dry.  Neurological:     General: No focal deficit present.     Mental Status: She is alert. Mental status is at baseline.  Psychiatric:        Mood and Affect: Mood normal.        Behavior: Behavior normal.     ED Results and Treatments Labs (all labs ordered are listed, but only abnormal results are displayed) Labs Reviewed  COMPREHENSIVE METABOLIC PANEL - Abnormal; Notable for the following components:      Result Value   Glucose, Bld 108 (*)    Calcium 8.6 (*)    Albumin 3.3 (*)    AST 64 (*)    ALT 70 (*)    All other components within normal limits  D-DIMER, QUANTITATIVE - Abnormal; Notable for the following components:   D-Dimer, Quant 0.64 (*)    All other components within normal limits  LIPASE, BLOOD - Abnormal; Notable for the following components:   Lipase 55 (*)  All other components within normal limits  CBC WITH DIFFERENTIAL/PLATELET - Abnormal; Notable for  the following components:   Hemoglobin 10.7 (*)    HCT 33.9 (*)    MCH 25.4 (*)    All other components within normal limits  PREGNANCY, URINE  TROPONIN I (HIGH SENSITIVITY)                                                                                                                          Radiology CT Angio Chest PE W/Cm &/Or Wo Cm  Result Date: 06/11/2022 CLINICAL DATA:  Chest pain for 1 month with abdominal pain, initial encounter EXAM: CT ANGIOGRAPHY CHEST CT ABDOMEN AND PELVIS WITH CONTRAST TECHNIQUE: Multidetector CT imaging of the chest was performed using the standard protocol during bolus administration of intravenous contrast. Multiplanar CT image reconstructions and MIPs were obtained to evaluate the vascular anatomy. Multidetector CT imaging of the abdomen and pelvis was performed using the standard protocol during bolus administration of intravenous contrast. RADIATION DOSE REDUCTION: This exam was performed according to the departmental dose-optimization program which includes automated exposure control, adjustment of the mA and/or kV according to patient size and/or use of iterative reconstruction technique. CONTRAST:  75mL OMNIPAQUE IOHEXOL 350 MG/ML SOLN COMPARISON:  Chest x-ray from earlier in the same day. FINDINGS: CTA CHEST FINDINGS Cardiovascular: Thoracic aorta and its branches are within normal limits. The pulmonary artery shows a normal branching pattern bilaterally. No filling defect to suggest pulmonary embolism is noted. No coronary calcifications are seen. Mediastinum/Nodes: Thoracic inlet is within normal limits. No hilar or mediastinal adenopathy is noted. The esophagus is within normal limits. Lungs/Pleura: Lungs are well aerated without focal infiltrate or sizable effusion. Musculoskeletal: No chest wall abnormality. No acute or significant osseous findings. Review of the MIP images confirms the above findings. CT ABDOMEN and PELVIS FINDINGS Hepatobiliary: No focal  liver abnormality is seen. Status post cholecystectomy. No biliary dilatation. Pancreas: Unremarkable. No pancreatic ductal dilatation or surrounding inflammatory changes. Spleen: Normal in size without focal abnormality. Adrenals/Urinary Tract: Adrenal glands are within normal limits. Kidneys demonstrate a normal enhancement pattern bilaterally. No renal calculi or obstructive changes are seen. The bladder is decompressed. Stomach/Bowel: The appendix is well visualized and within normal limits. Fecal material is noted scattered throughout the colon. Small bowel and stomach are within normal limits. Vascular/Lymphatic: No significant vascular findings are present. No enlarged abdominal or pelvic lymph nodes. Reproductive: Uterus demonstrates partially calcified fibroids similar to that seen on prior ultrasound. No adnexal mass is noted. Other: No abdominal wall hernia or abnormality. No abdominopelvic ascites. Musculoskeletal: No acute or significant osseous findings. Review of the MIP images confirms the above findings. IMPRESSION: CTA of the chest: No evidence of pulmonary emboli. No acute abnormality noted. CT of the abdomen and pelvis: Uterine fibroids similar to that seen on prior exam. No acute abnormality is noted in the abdomen. Electronically Signed   By: Alcide Clever M.D.   On: 06/11/2022 00:14  CT ABDOMEN PELVIS W CONTRAST  Result Date: 06/11/2022 CLINICAL DATA:  Chest pain for 1 month with abdominal pain, initial encounter EXAM: CT ANGIOGRAPHY CHEST CT ABDOMEN AND PELVIS WITH CONTRAST TECHNIQUE: Multidetector CT imaging of the chest was performed using the standard protocol during bolus administration of intravenous contrast. Multiplanar CT image reconstructions and MIPs were obtained to evaluate the vascular anatomy. Multidetector CT imaging of the abdomen and pelvis was performed using the standard protocol during bolus administration of intravenous contrast. RADIATION DOSE REDUCTION: This exam  was performed according to the departmental dose-optimization program which includes automated exposure control, adjustment of the mA and/or kV according to patient size and/or use of iterative reconstruction technique. CONTRAST:  75mL OMNIPAQUE IOHEXOL 350 MG/ML SOLN COMPARISON:  Chest x-ray from earlier in the same day. FINDINGS: CTA CHEST FINDINGS Cardiovascular: Thoracic aorta and its branches are within normal limits. The pulmonary artery shows a normal branching pattern bilaterally. No filling defect to suggest pulmonary embolism is noted. No coronary calcifications are seen. Mediastinum/Nodes: Thoracic inlet is within normal limits. No hilar or mediastinal adenopathy is noted. The esophagus is within normal limits. Lungs/Pleura: Lungs are well aerated without focal infiltrate or sizable effusion. Musculoskeletal: No chest wall abnormality. No acute or significant osseous findings. Review of the MIP images confirms the above findings. CT ABDOMEN and PELVIS FINDINGS Hepatobiliary: No focal liver abnormality is seen. Status post cholecystectomy. No biliary dilatation. Pancreas: Unremarkable. No pancreatic ductal dilatation or surrounding inflammatory changes. Spleen: Normal in size without focal abnormality. Adrenals/Urinary Tract: Adrenal glands are within normal limits. Kidneys demonstrate a normal enhancement pattern bilaterally. No renal calculi or obstructive changes are seen. The bladder is decompressed. Stomach/Bowel: The appendix is well visualized and within normal limits. Fecal material is noted scattered throughout the colon. Small bowel and stomach are within normal limits. Vascular/Lymphatic: No significant vascular findings are present. No enlarged abdominal or pelvic lymph nodes. Reproductive: Uterus demonstrates partially calcified fibroids similar to that seen on prior ultrasound. No adnexal mass is noted. Other: No abdominal wall hernia or abnormality. No abdominopelvic ascites.  Musculoskeletal: No acute or significant osseous findings. Review of the MIP images confirms the above findings. IMPRESSION: CTA of the chest: No evidence of pulmonary emboli. No acute abnormality noted. CT of the abdomen and pelvis: Uterine fibroids similar to that seen on prior exam. No acute abnormality is noted in the abdomen. Electronically Signed   By: Alcide Clever M.D.   On: 06/11/2022 00:14   DG Chest 2 View  Result Date: 06/10/2022 CLINICAL DATA:  Rib pain. EXAM: CHEST - 2 VIEW COMPARISON:  January 12, 2013 FINDINGS: The heart size and mediastinal contours are within normal limits. Both lungs are clear. The visualized skeletal structures are unremarkable. IMPRESSION: No active cardiopulmonary disease. Electronically Signed   By: Ted Mcalpine M.D.   On: 06/10/2022 19:40    Pertinent labs & imaging results that were available during my care of the patient were reviewed by me and considered in my medical decision making (see MDM for details).  Medications Ordered in ED Medications  alum & mag hydroxide-simeth (MAALOX/MYLANTA) 200-200-20 MG/5ML suspension 30 mL (30 mLs Oral Given 06/10/22 2242)    And  lidocaine (XYLOCAINE) 2 % viscous mouth solution 15 mL (15 mLs Oral Given 06/10/22 2242)  iohexol (OMNIPAQUE) 350 MG/ML injection 75 mL (75 mLs Intravenous Contrast Given 06/10/22 2358)  oxyCODONE-acetaminophen (PERCOCET/ROXICET) 5-325 MG per tablet 1 tablet (1 tablet Oral Given 06/11/22 0013)  Procedures Procedures  (including critical care time)  Medical Decision Making / ED Course   MDM:  37 year old female presenting to the emergency department with lower chest and upper abdominal pain.  Patient well-appearing, EKG without signs of ischemia.  Vitals reassuring.  No hypoxia or tachycardia.  Unclear cause of symptoms, considered pulmonary embolism  given family history and obtain D-dimer which is positive.  Patient is obese and taking oral contraceptives.  Will obtain CT angiography of the chest.  Chest x-ray clear with no evidence of pneumonia.  Very low concern for ACS given very atypical nature of symptoms.  Consider gastritis, treated with Maalox without any improvement.  Lipase minimally elevated, extremely low concern for pancreatitis.  Patient has very mild liver abnormalities probably most likely related to obesity rather than acute process.  She has no focal right upper quadrant tenderness to suggest cholecystitis.  Will also obtain CT abdomen given upper abdominal tenderness and LFT abnormalities. Signed out pending CT scans.        Lab Tests: -I ordered, reviewed, and interpreted labs.   The pertinent results include:   Labs Reviewed  COMPREHENSIVE METABOLIC PANEL - Abnormal; Notable for the following components:      Result Value   Glucose, Bld 108 (*)    Calcium 8.6 (*)    Albumin 3.3 (*)    AST 64 (*)    ALT 70 (*)    All other components within normal limits  D-DIMER, QUANTITATIVE - Abnormal; Notable for the following components:   D-Dimer, Quant 0.64 (*)    All other components within normal limits  LIPASE, BLOOD - Abnormal; Notable for the following components:   Lipase 55 (*)    All other components within normal limits  CBC WITH DIFFERENTIAL/PLATELET - Abnormal; Notable for the following components:   Hemoglobin 10.7 (*)    HCT 33.9 (*)    MCH 25.4 (*)    All other components within normal limits  PREGNANCY, URINE  TROPONIN I (HIGH SENSITIVITY)    Notable for positive d-dimer. Mild elevated LFTs of unclear significance  EKG   EKG Interpretation  Date/Time:  Saturday June 10 2022 19:04:56 EDT Ventricular Rate:  98 PR Interval:  142 QRS Duration: 97 QT Interval:  349 QTC Calculation: 446 R Axis:   71 Text Interpretation: Sinus rhythm Borderline T abnormalities, anterior leads ST elev, probable  normal early repol pattern Confirmed by Alvino Blood ([id number]) on 06/10/2022 10:55:25 PM         Imaging Studies ordered: I ordered imaging studies including CXR On my interpretation imaging demonstrates no acute process I independently visualized and interpreted imaging. I agree with the radiologist interpretation   Medicines ordered and prescription drug management: Meds ordered this encounter  Medications   AND Linked Order Group    alum & mag hydroxide-simeth (MAALOX/MYLANTA) 200-200-20 MG/5ML suspension 30 mL    lidocaine (XYLOCAINE) 2 % viscous mouth solution 15 mL   iohexol (OMNIPAQUE) 350 MG/ML injection 75 mL   oxyCODONE-acetaminophen (PERCOCET/ROXICET) 5-325 MG per tablet 1 tablet   hydrochlorothiazide (HYDRODIURIL) 25 MG tablet    Sig: Take 1 tablet (25 mg total) by mouth daily.    Dispense:  15 tablet    Refill:  1   ibuprofen (ADVIL) 600 MG tablet    Sig: Take 1 tablet (600 mg total) by mouth every 6 (six) hours as needed.    Dispense:  30 tablet    Refill:  0    -I have reviewed the  patients home medicines and have made adjustments as needed   Cardiac Monitoring: The patient was maintained on a cardiac monitor.  I personally viewed and interpreted the cardiac monitored which showed an underlying rhythm of: NSR  Social Determinants of Health:  Diagnosis or treatment significantly limited by social determinants of health: obesity   Reevaluation: After the interventions noted above, I reevaluated the patient and found that their symptoms have improved  Co morbidities that complicate the patient evaluation  Past Medical History:  Diagnosis Date   C. difficile diarrhea 02/13/2009   Chlamydia    Clostridium difficile infection 02/13/2009   Fibroid    Hx of pyelonephritis    UTI (urinary tract infection)    Vaginal Pap smear, abnormal       Dispostion: Disposition decision including need for hospitalization was considered, and patient disposition  pending at time of sign out.    Final Clinical Impression(s) / ED Diagnoses Final diagnoses:  Nonspecific chest pain  Generalized abdominal pain     This chart was dictated using voice recognition software.  Despite best efforts to proofread,  errors can occur which can change the documentation meaning.    Lonell Grandchild, MD 06/11/22 [phone]

## 2022-06-10 NOTE — ED Triage Notes (Signed)
Pt w/ multiple complaints: Pt c/o constant bilateral rib pain and some upper back pain x 1 month; sore on the inside of bottom lip; "fluid" on bil knees; knot behind LT knee that is painful to palpation

## 2022-06-11 MED ORDER — HYDROCHLOROTHIAZIDE 25 MG PO TABS: 25.0000 mg | ORAL_TABLET | Freq: Every day | ORAL | 1 refills | Status: AC

## 2022-06-11 MED ORDER — IBUPROFEN 600 MG PO TABS: 600.0000 mg | ORAL_TABLET | Freq: Four times a day (QID) | ORAL | 0 refills | Status: AC | PRN

## 2022-06-11 NOTE — Discharge Instructions (Signed)
Begin taking ibuprofen 600 mg every 6 hours as needed for pain.  Follow-up with your primary doctor if symptoms are not improving in the next few days.

## 2022-06-11 NOTE — ED Provider Notes (Signed)
  Physical Exam  BP 129/83   Pulse 70   Temp 98.9 F (37.2 C)   Resp 16   Ht 5\' 4"  (1.626 m)   Wt 108.9 kg   LMP 05/29/2022 Comment: nexplanon  SpO2 98%   BMI 41.20 kg/m   Physical Exam Vitals and nursing note reviewed.  Constitutional:      Appearance: She is well-developed.  HENT:     Head: Normocephalic and atraumatic.  Pulmonary:     Effort: Pulmonary effort is normal.  Neurological:     Mental Status: She is alert and oriented to person, place, and time.     Procedures  Procedures  ED Course / MDM    Medical Decision Making Amount and/or Complexity of Data Reviewed Labs: ordered. Radiology: ordered.  Risk OTC drugs. Prescription drug management.   Care assumed from Dr. Rhoderick Moody at shift change.  Patient awaiting results of CTA of the chest, abdomen, and pelvis that was ordered to evaluate for chest and abdominal pain.  This study has returned showing no evidence for intra-abdominal emergent pathology and no sign of pulmonary embolism or dissection.  Patient seems to be feeling better and I feel as though emergent pathology has been ruled out.  Patient to be discharged with ibuprofen, rest, and time.  She has requested a prescription for hydrochlorothiazide as she has been on this in the past for leg swelling.  I have agreed to provide this for her.       Geoffery Lyons, MD 06/11/22 [phone]

## 2022-11-02 NOTE — Progress Notes (Deleted)
NEW PATIENT Date of Service/Encounter:  11/02/22 Referring provider: {Blank single:19197::"Davis, Ricky Stabs, PA-C","none-self referred"} Primary care provider: Susann Givens, PA-C  Subjective:  Kendra Guerrero is a 37 y.o. female with a PMHx of *** presenting today for evaluation of *** History obtained from: chart review and {Persons; PED relatives w/patient:19415::"patient"}.   ***  Chart Review:  Reviewed PCP notes from referral 10/03/2022: Urticarial rash-continued oral antihistamine, Singulair added.  Environmental allergy panel obtained via blood work. -Serum environmental panel 10/03/2022 positive to ragweed only (1.32), CMP normal, total IgE 8, hep A AB and C not detected  Other allergy screening: Asthma: {Blank single:19197::"yes","no"} Rhino conjunctivitis: {Blank single:19197::"yes","no"} Food allergy: {Blank single:19197::"yes","no"} Medication allergy: {Blank single:19197::"yes","no"} Hymenoptera allergy: {Blank single:19197::"yes","no"} Urticaria: {Blank single:19197::"yes","no"} Eczema:{Blank single:19197::"yes","no"} History of recurrent infections suggestive of immunodeficency: {Blank single:19197::"yes","no"} ***Vaccinations are up to date.   Past Medical History: Past Medical History:  Diagnosis Date   C. difficile diarrhea 02/13/2009   Chlamydia    Clostridium difficile infection 02/13/2009   Fibroid    Hx of pyelonephritis    UTI (urinary tract infection)    Vaginal Pap smear, abnormal    Medication List:  Current Outpatient Medications  Medication Sig Dispense Refill   desogestrel-ethinyl estradiol (APRI) 0.15-30 MG-MCG tablet Take 1 tablet by mouth daily. 28 tablet 11   hydrochlorothiazide (HYDRODIURIL) 25 MG tablet Take 1 tablet (25 mg total) by mouth daily. 15 tablet 1   ibuprofen (ADVIL) 600 MG tablet Take 1 tablet (600 mg total) by mouth every 6 (six) hours as needed. 30 tablet 0   megestrol (MEGACE) 40 MG tablet Take 1 tablet (40 mg  total) by mouth 2 (two) times daily. Can increase to two tablets twice a day in the event of heavy bleeding 60 tablet 5   tranexamic acid (LYSTEDA) 650 MG TABS tablet Take 2 tablets (1,300 mg total) by mouth 3 (three) times daily. Take during menses for a maximum of five days 30 tablet 2   No current facility-administered medications for this visit.   Known Allergies:  Allergies  Allergen Reactions   Apple Juice Other (See Comments)    Reaction:  Unknown    Apple    Codeine Nausea And Vomiting   Past Surgical History: Past Surgical History:  Procedure Laterality Date   CHOLECYSTECTOMY N/A 07/02/2021   Procedure: LAPAROSCOPIC CHOLECYSTECTOMY SINGLE SITE;  WEDGE LIVER BIOPSY;  Surgeon: Karie Soda, MD;  Location: [facility];  Service: General;  Laterality: N/A;   CYSTECTOMY     back of neck   DILATION AND CURETTAGE OF UTERUS     Family History: Family History  Problem Relation Age of Onset   Diabetes Mother    Stroke Mother    Healthy Father    Hypertension Maternal Grandmother    Anesthesia problems Neg Hx    Hypotension Neg Hx    Malignant hyperthermia Neg Hx    Pseudochol deficiency Neg Hx    Social History: Brendi lives ***.   ROS:  All other systems negative except as noted per HPI.  Objective:  not currently breastfeeding. There is no height or weight on file to calculate BMI. Physical Exam:  General Appearance:  Alert, cooperative, no distress, appears stated age  Head:  Normocephalic, without obvious abnormality, atraumatic  Eyes:  Conjunctiva clear, EOM's intact  Ears {Blank multiple:19196:a:"***","EACs normal bilaterally","normal TMs bilaterally","ear tubes present bilaterally without exudate"}  Nose: Nares normal, {Blank multiple:19196:a:"***","hypertrophic turbinates","normal mucosa","no visible anterior polyps","septum midline"}  Throat: Lips, tongue normal; teeth and  gums normal, {Blank multiple:19196:a:"***","normal posterior oropharynx","tonsils  2+","tonsils 3+","no tonsillar exudate","+ cobblestoning","surgically absent tonsils","mildly erythematous posterior oropharynx"}  Neck: Supple, symmetrical  Lungs:   {Blank multiple:19196:a:"***","clear to auscultation bilaterally","end-expiratory wheezing","wheezing throughout"}, Respirations unlabored, {Blank multiple:19196:a:"***","no coughing","intermittent dry coughing","intermittent productive-sounding cough"}  Heart:  {Blank multiple:19196:a:"***","regular rate and rhythm","no murmur"}, Appears well perfused  Extremities: No edema  Skin: {Blank multiple:19196:a:"***","erythematous, dry patches scattered on ***","lichenification on ***","Skin color, texture, turgor normal","no rashes or lesions on visualized portions of skin"}  Neurologic: No gross deficits   Diagnostics: Spirometry:  Tracings reviewed. Her effort: {Blank single:19197::"Good reproducible efforts.","It was hard to get consistent efforts and there is a question as to whether this reflects a maximal maneuver.","Poor effort, data can not be interpreted.","Variable effort-results affected","effort okay for first attempt at spirometry.","Results not reproducible due to ***"} FVC: ***L (pre), ***L  (post) FEV1: ***L, ***% predicted (pre), ***L, ***% predicted (post) FEV1/FVC ratio: *** (pre), *** (post) Interpretation: {Blank single:19197::"Spirometry consistent with mild obstructive disease","Spirometry consistent with moderate obstructive disease","Spirometry consistent with severe obstructive disease","Spirometry consistent with possible restrictive disease","Spirometry consistent with mixed obstructive and restrictive disease","Spirometry uninterpretable due to technique","Spirometry consistent with normal pattern","No overt abnormalities noted given today's efforts","Nonobstructive ratio, low FEV1","Nonobstructive ratio, low FEV1, possible restriction"}.  Please see scanned spirometry results for details.  Skin Testing: {Blank  single:19197::"Select foods","Environmental allergy panel","Environmental allergy panel and select foods","Food allergy panel","None","Deferred due to recent antihistamines use","deferred due to recent reaction","Pediatric Environmental Allergy Panel","Pediatric Food Panel","Select foods and environmental allergies"}. {Blank single:19197::"Adequate positive and negative controls","Inadequate positive control-testing invalid","Adequate positive and negative controls, dermatographism present, testing difficult to interpret"}. Results discussed with patient/family.   {Blank single:19197::"Allergy testing results were read and interpreted by myself, documented by clinical staff.","Allergy testing results were read by ***,FNP, documented by clinical staff"}  Labs:  Lab Orders  No laboratory test(s) ordered today     Assessment and Plan  ***  {Blank single:19197::"This note in its entirety was forwarded to the Provider who requested this consultation."}  Other: {Blank multiple:19196:a:"***","samples provided of: ***","school forms provided","reviewed spirometry technique","reviewed inhaler technique"}  Thank you for your kind referral. I appreciate the opportunity to take part in [place] care. Please do not hesitate to contact me with questions.***  Sincerely,  Tonny Bollman, MD [facility]

## 2022-11-06 ENCOUNTER — Ambulatory Visit: Payer: Medicaid Other | Admitting: Internal Medicine

## 2023-07-12 ENCOUNTER — Encounter: Payer: Self-pay | Admitting: Obstetrics and Gynecology

## 2023-07-12 ENCOUNTER — Other Ambulatory Visit: Payer: Self-pay

## 2023-07-12 ENCOUNTER — Ambulatory Visit: Admitting: Obstetrics and Gynecology

## 2023-07-12 VITALS — BP 146/94 | HR 81 | Wt 252.0 lb

## 2023-07-12 DIAGNOSIS — Z3046 Encounter for surveillance of implantable subdermal contraceptive: Secondary | ICD-10-CM

## 2023-07-12 DIAGNOSIS — Z1331 Encounter for screening for depression: Secondary | ICD-10-CM | POA: Diagnosis not present

## 2023-07-12 NOTE — Progress Notes (Signed)
 GYNECOLOGY VISIT  Patient name: Kendra Guerrero MRN [id number]  Date of birth: [birth year] Chief Complaint:   Nexplanon  Removal  History:  Kendra Guerrero is a 38 y.o. [id number] being seen today for nexplanon  removal due to weight concerns. Reports wants to "let (her) body breathe"  and does not need contraception currently as she is not sexually active.  Nexplanon  inserted 2023. Would consider pills if she decides to restart anything.   Last seen 10/2021: DUB in setting of implant, started on OCPs and lysteda  with plan for removal of implant.   Past Medical History:  Diagnosis Date   C. difficile diarrhea 02/13/2009   Chlamydia    Clostridium difficile infection 02/13/2009   Fibroid    Hx of pyelonephritis    UTI (urinary tract infection)    Vaginal Pap smear, abnormal     Past Surgical History:  Procedure Laterality Date   CHOLECYSTECTOMY N/A 07/02/2021   Procedure: LAPAROSCOPIC CHOLECYSTECTOMY SINGLE SITE;  WEDGE LIVER BIOPSY;  Surgeon: Candyce Champagne, MD;  Location: [facility];  Service: General;  Laterality: N/A;   CYSTECTOMY     back of neck   DILATION AND CURETTAGE OF UTERUS      The following portions of the patient's history were reviewed and updated as appropriate: allergies, current medications, past family history, past medical history, past social history, past surgical history and problem list.   Health Maintenance:   Last pap 02/15/2022 NILM Last mammogram: n/a EMB 02/15/2022: proliferative endometrium    Review of Systems:  Pertinent items are noted in HPI. Comprehensive review of systems was otherwise negative.   Objective:  Physical Exam BP (!) 143/92   Pulse 77   Wt 252 lb (114.3 kg)   LMP 06/14/2023 (Within Days)   BMI 43.26 kg/m    Physical Exam Vitals and nursing note reviewed.  Constitutional:      Appearance: Normal appearance.  HENT:     Head: Normocephalic and atraumatic.  Pulmonary:     Effort: Pulmonary effort is normal.  Skin:     General: Skin is warm and dry.  Neurological:     General: No focal deficit present.     Mental Status: She is alert.  Psychiatric:        Mood and Affect: Mood normal.        Behavior: Behavior normal.        Thought Content: Thought content normal.        Judgment: Judgment normal.    NEXPLANON  REMOVAL The risks (including infection, bleeding, pain, and uterine perforation) and benefits of the procedure were explained to the patient and Written informed consent was obtained.   Device was palpated in left upper arm. After time out, the skin was cleaned with alcohol and infiltrated with 1cc of 1% lidocaine  with epinephrine  was used to infiltrate the skin and subcutaneous tissue deep to the device. The area was cleaned with betadine x2.  Using an 11 blade, the skin was incised and the implant was removed intact.  The implant was shown to the patient.  The skin was cleaned, incision covered with Steri-Strips, and an adhesive bandage.  Arm was wrapped and post procedure instructions provided to the patient.  None chose as new contraceptive method.       Assessment & Plan:   1. Encounter for Nexplanon  removal (Primary) Now s/p uncomplicated removal. Will continue with abstinence for contraception at this time. Given post-removal instructions. Noted that once nexplanon  removed, contraceptive benefit is  immediately lost and menses should resume previous pattern.    Routine preventative health maintenance measures emphasized.  Kiki Pelton, MD [facility], [facility]

## 2023-07-12 NOTE — Patient Instructions (Signed)
 Your NEXPLANON implant was just removed Here is some helpful information on what you can expect and how to care for your removal site. 24 Hours wear your top bandage The compression bandage helps minimize bruising.  3-5 Days keep your implant site covered While the insertion site is healing, keep the area covered with a smaller bandage.

## 2023-12-13 IMAGING — US US FETAL BPP W/ NON-STRESS
1 series · 13 of 13 positions shown · non-contrast
Comparison: none

[Series 1: us fetal bpp w/ non-stress · 13 acquisitions, 13 frames shown]
[im 1/13]
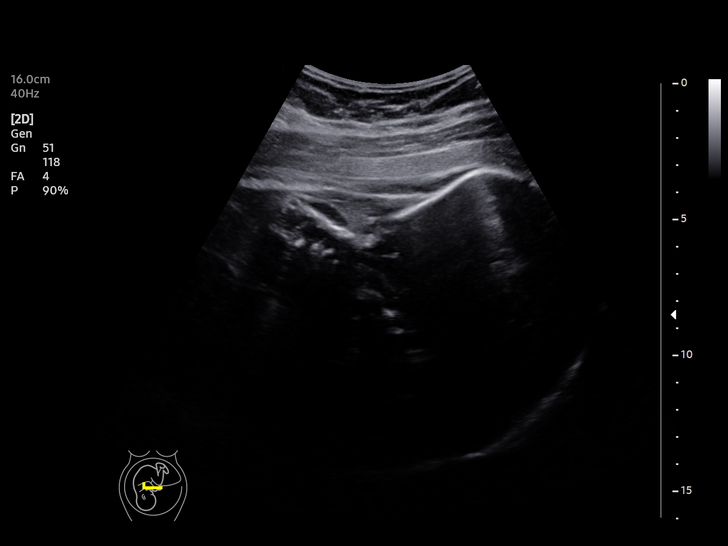
[im 2/13]
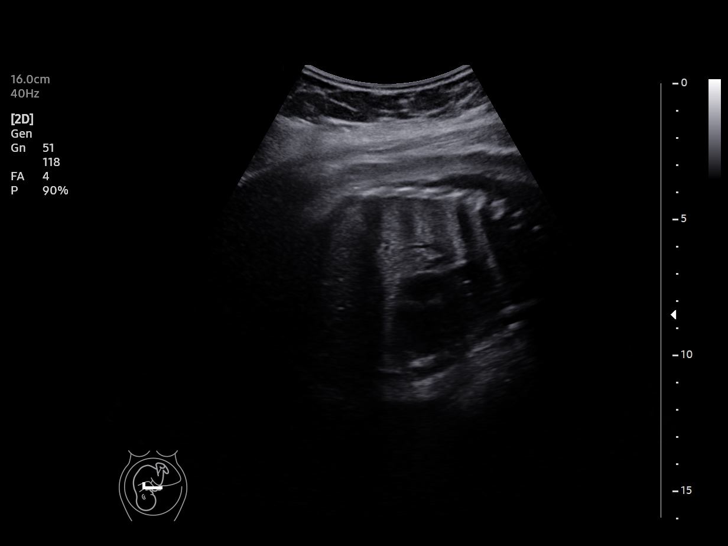
[im 3/13]
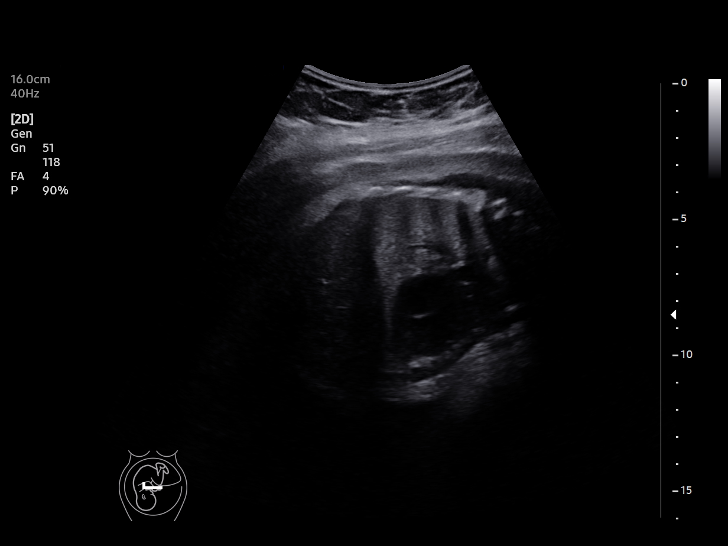
[im 4/13]
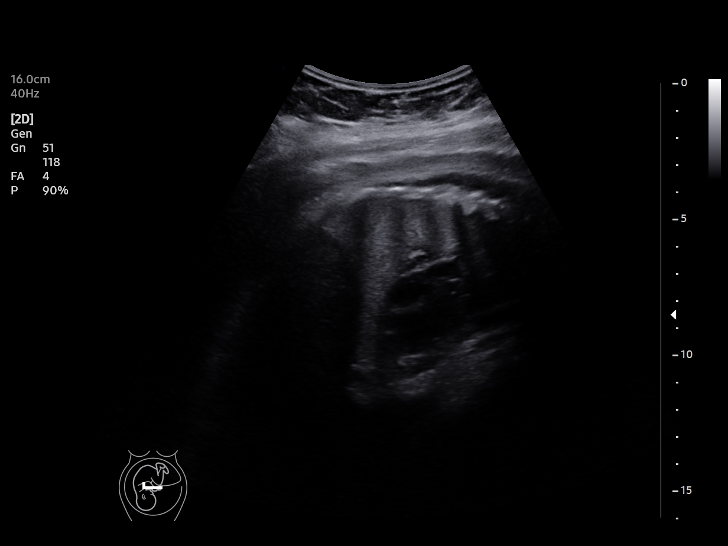
[im 5/13]
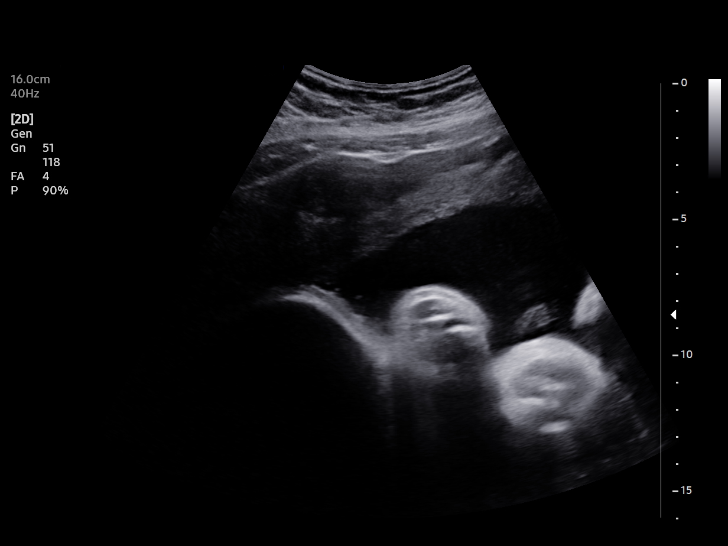
[im 6/13]
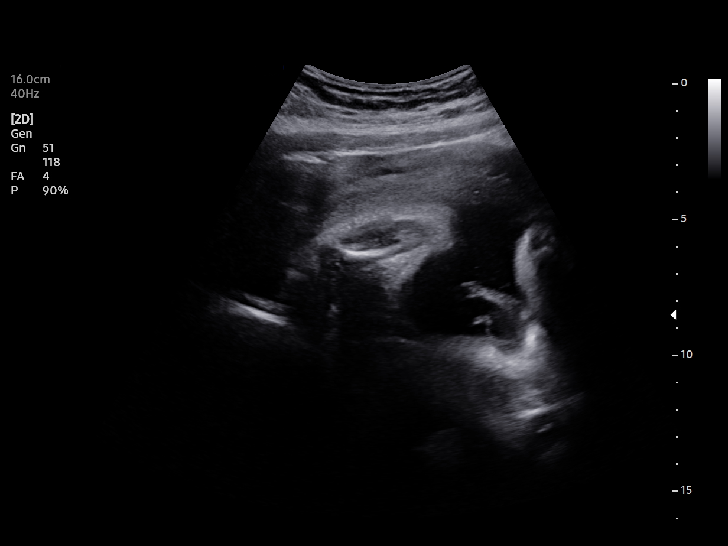
[im 7/13]
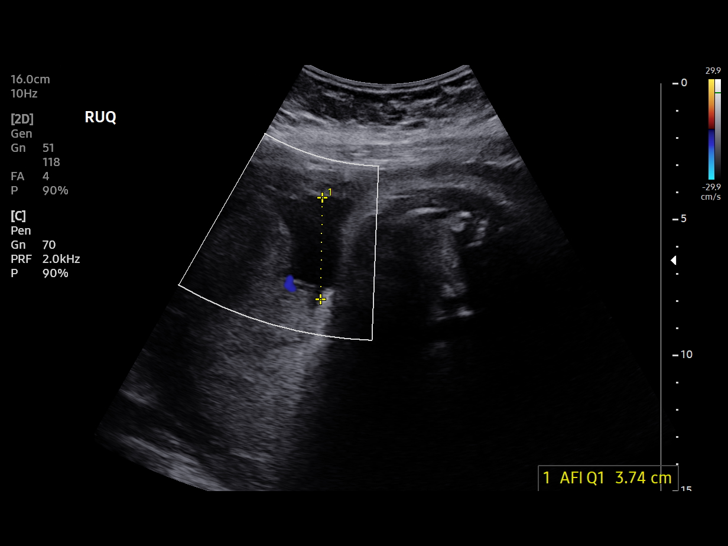
[im 8/13]
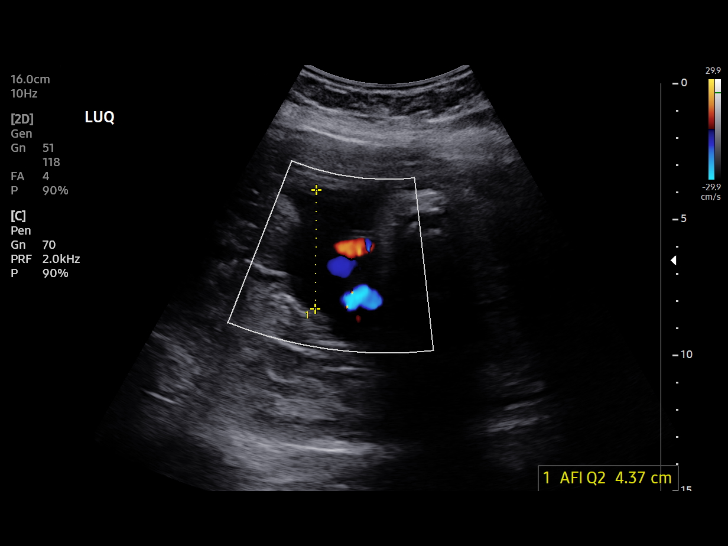
[im 9/13]
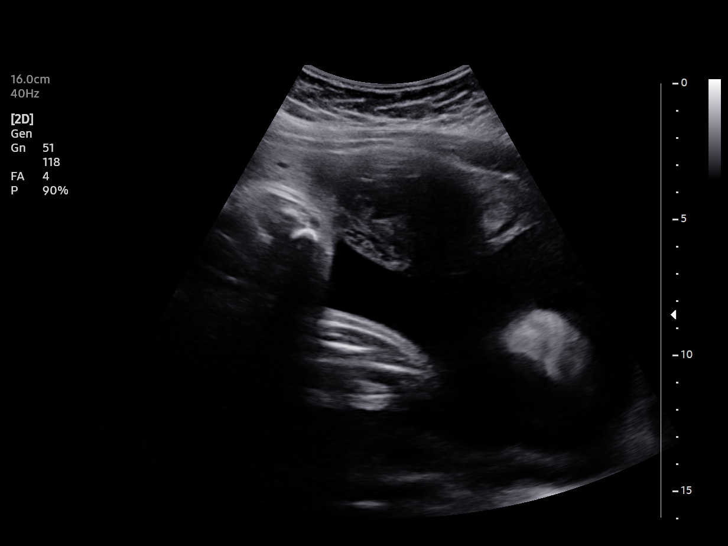
[im 10/13]
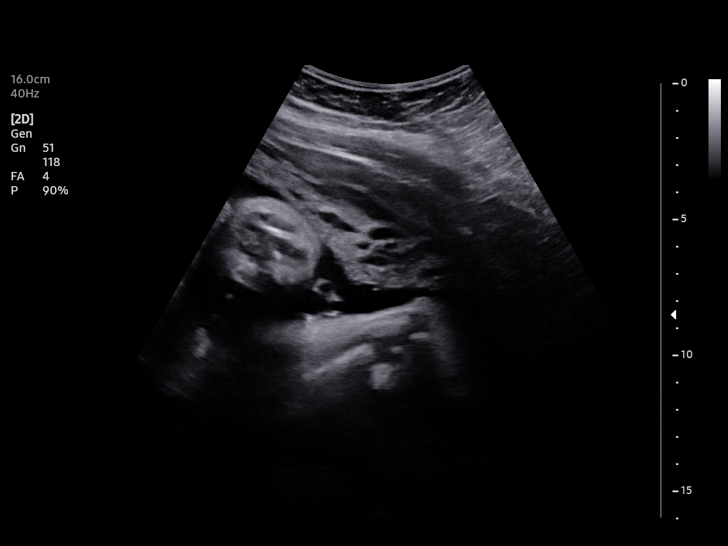
[im 11/13]
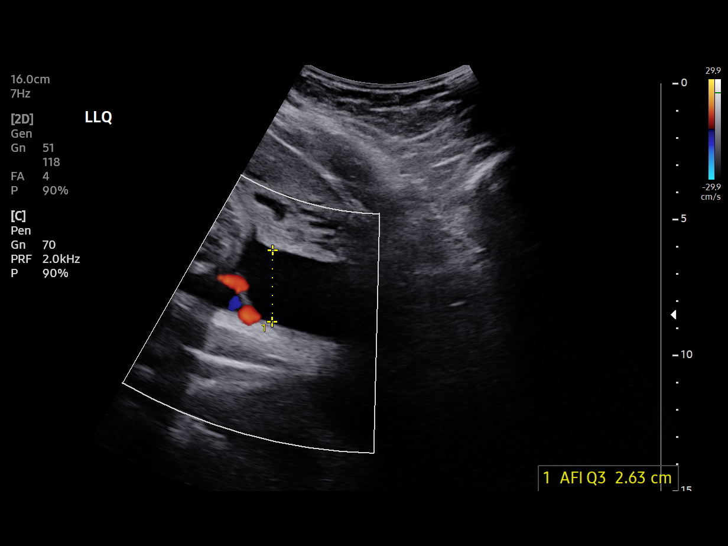
[im 12/13]
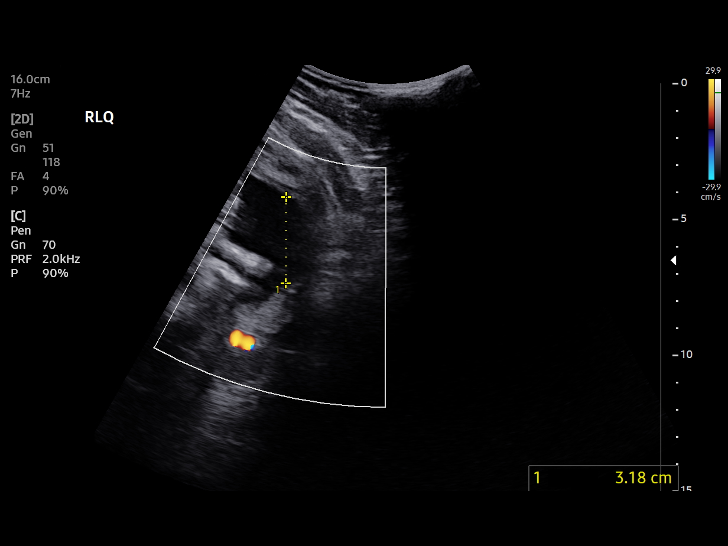
[im 13/13]
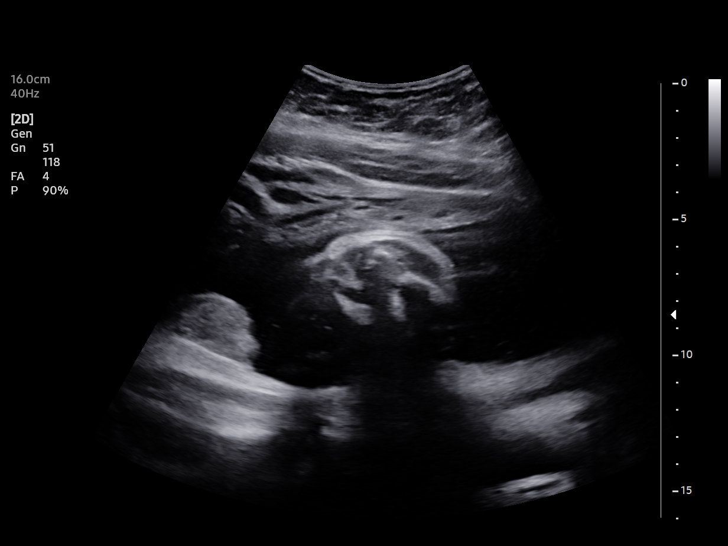

[13 of 13 positions shown; findings below may reference images not displayed]

Healthcare at

 1  US FETAL BPP W/NONSTRESS              76818.4     [facility]

Indications

 35 weeks gestation of pregnancy
 Polyhydramnios, third trimester, antepartum
 condition or complication, fetus 1
Fetal Evaluation

 Num Of Fetuses:         1
 Preg. Location:         Intrauterine
 Cardiac Activity:       Observed
 Fetal Lie:              Maternal right side
 Presentation:           Cephalic

 Amniotic Fluid
 AFI FV:      Subjectively upper-normal

 AFI Sum(cm)     %Tile       Largest Pocket(cm)
 21.87           83

 RUQ(cm)       RLQ(cm)       LUQ(cm)        LLQ(cm)

Biophysical Evaluation

 Amniotic F.V:   Pocket => 2 cm             F. Tone:        Observed
 F. Movement:    Observed                   N.S.T:          Reactive
 F. Breathing:   Observed                   Score:          [DATE]
OB History

 Blood Type:   A+
 Gravidity:    5         Term:   2         SAB:   1
 TOP:          1        Living:  2
Gestational Age

 LMP:           35w 1d        Date:  07/18/20                 EDD:   04/24/21
 Best:          35w 1d     Det. By:  LMP  (07/18/20)          EDD:   04/24/21
Impression

 Reassuring testing
Recommendations

 Continue weekly testing until delivery per MFM
 recommendations.
                Locklear, Savio

## 2024-03-06 IMAGING — US US ABDOMEN LIMITED
1 series · 14 of 25 positions shown · non-contrast
Comparison: None Available.

CLINICAL DATA: Right upper quadrant pain

EXAM:
ULTRASOUND ABDOMEN LIMITED RIGHT UPPER QUADRANT

[Series 1: us abdomen limited · 14 of 84 slices shown]
[im 1/84]
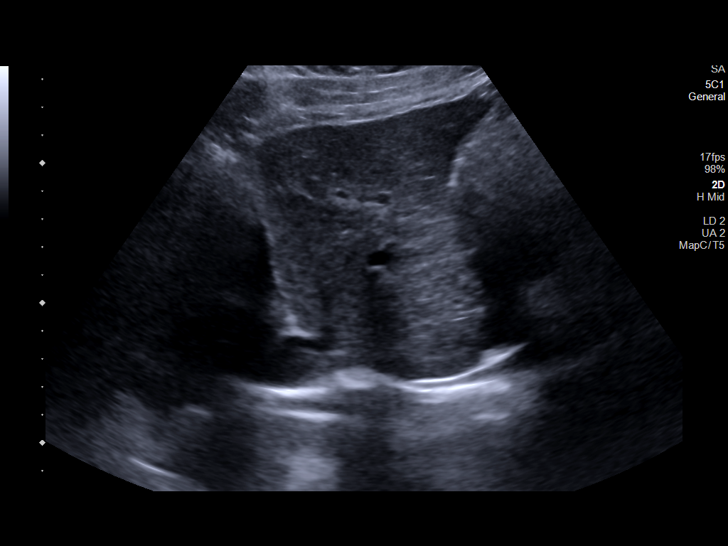
[im 7/84]
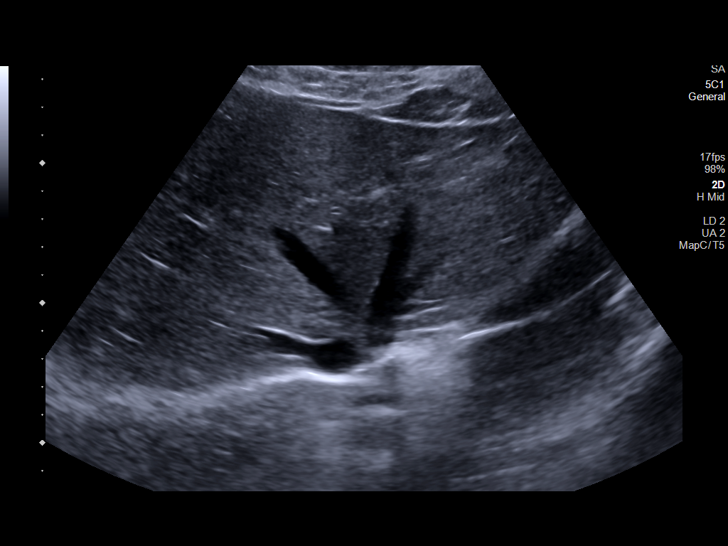
[im 14/84]
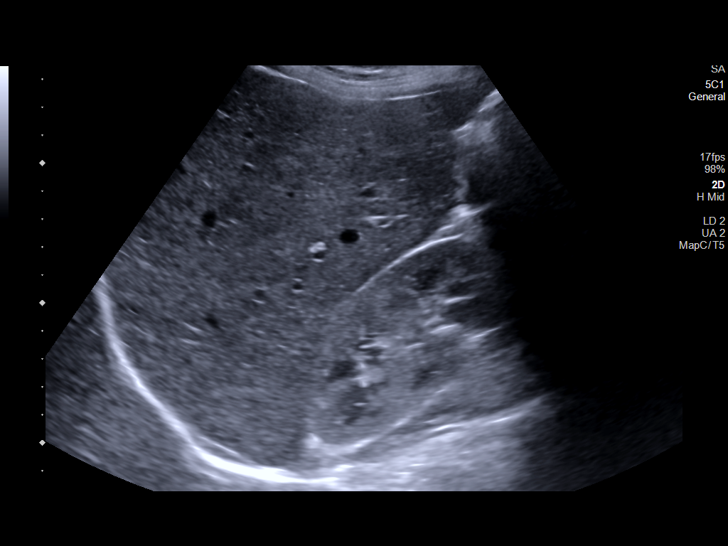
[im 21/84]
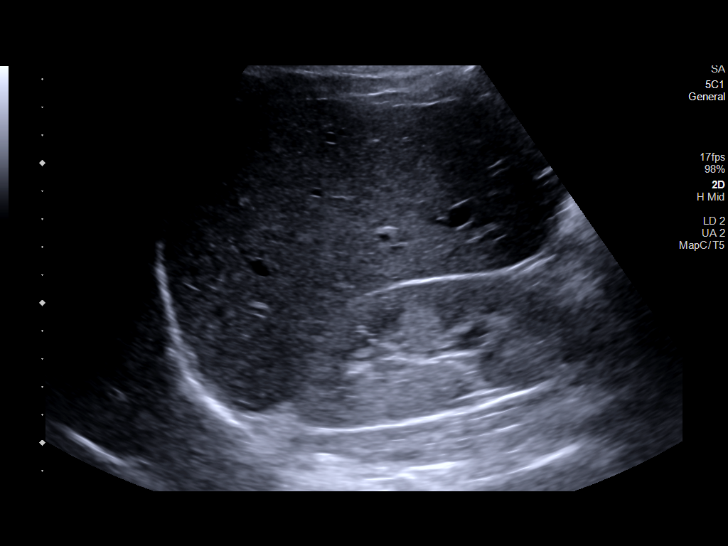
[im 28/84]
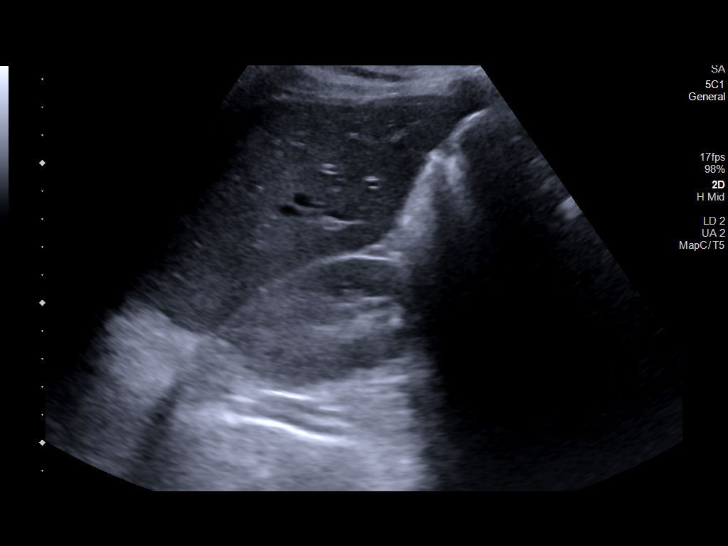
[im 32/84]
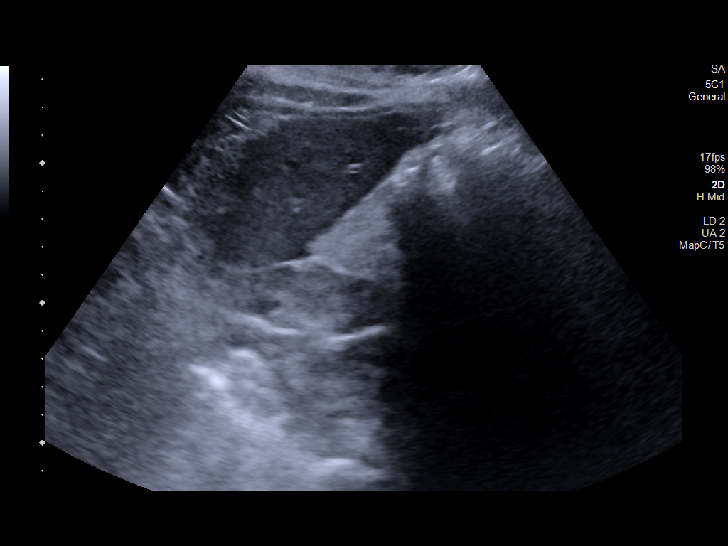
[im 39/84]
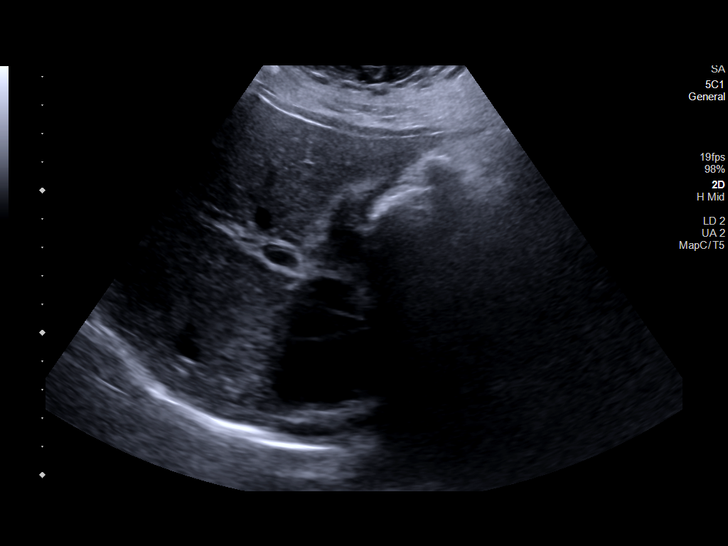
[im 45/84]
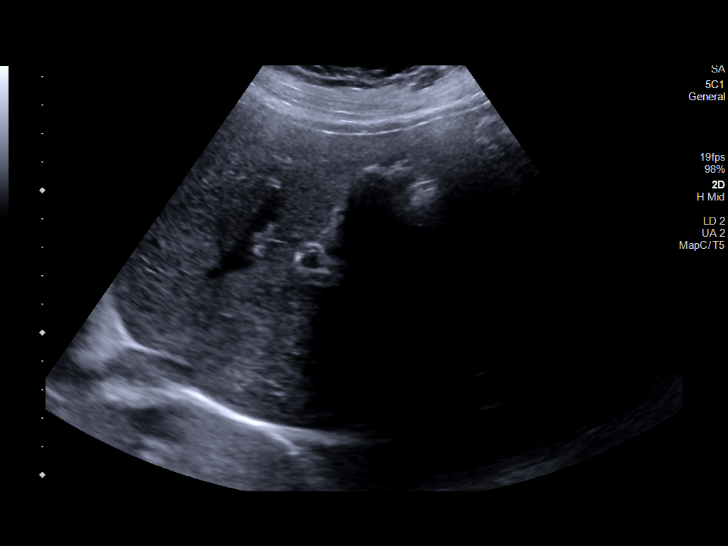
[im 52/84]
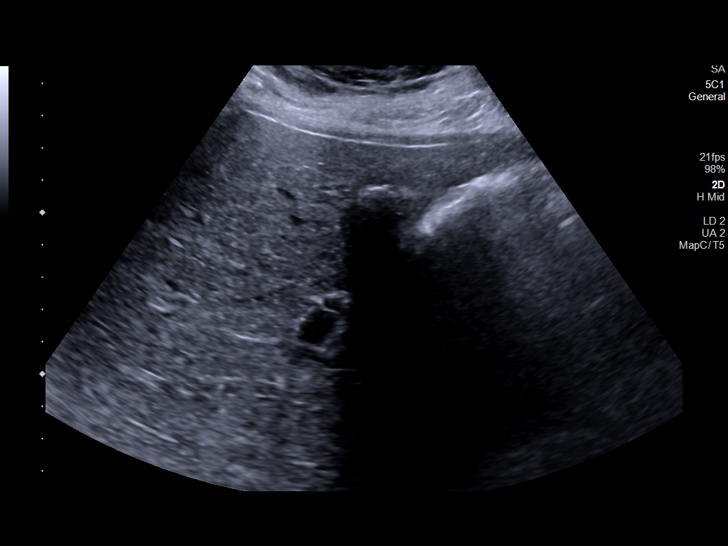
[im 56/84]
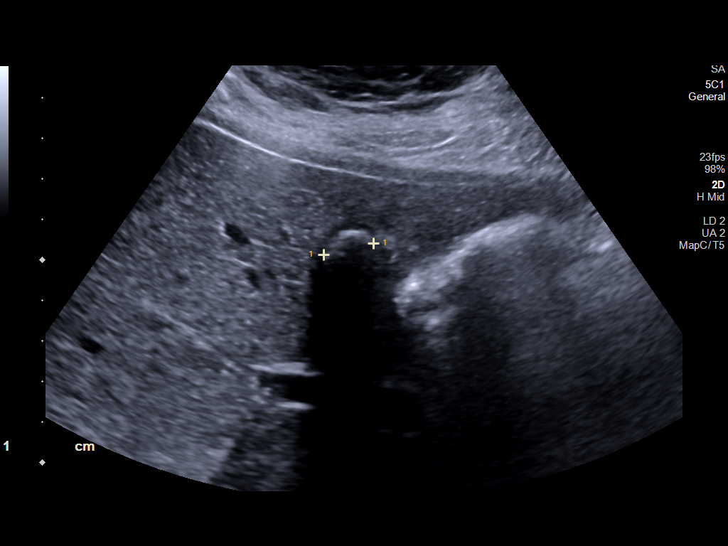
[im 63/84]
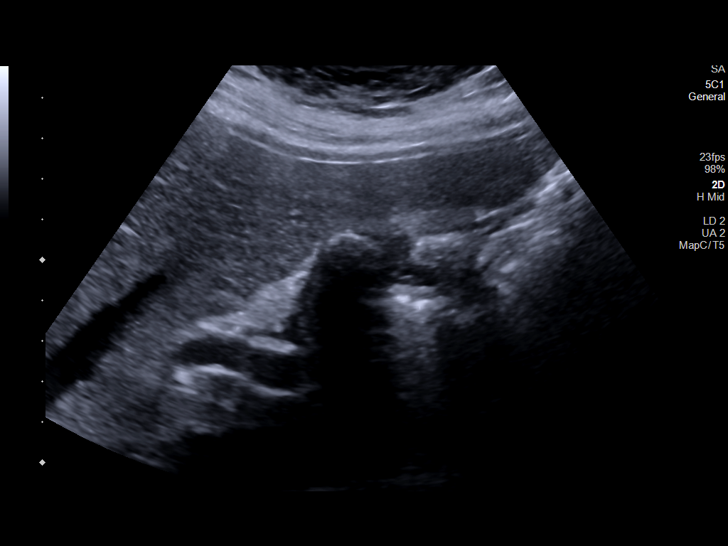
[im 70/84]
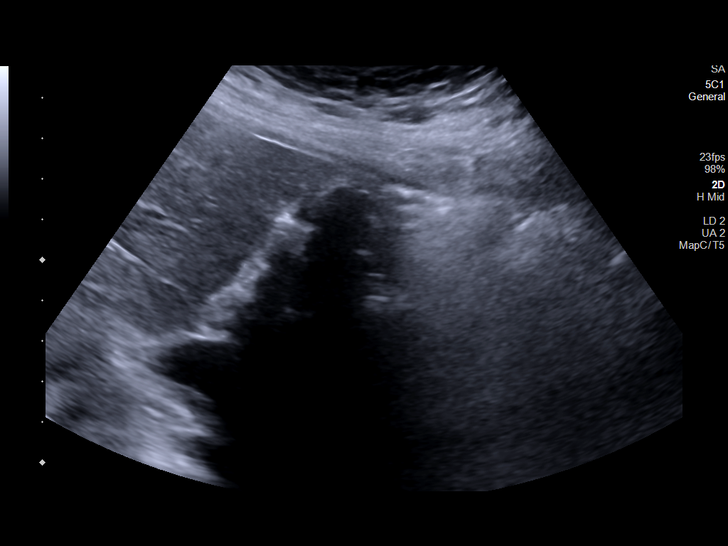
[im 77/84]
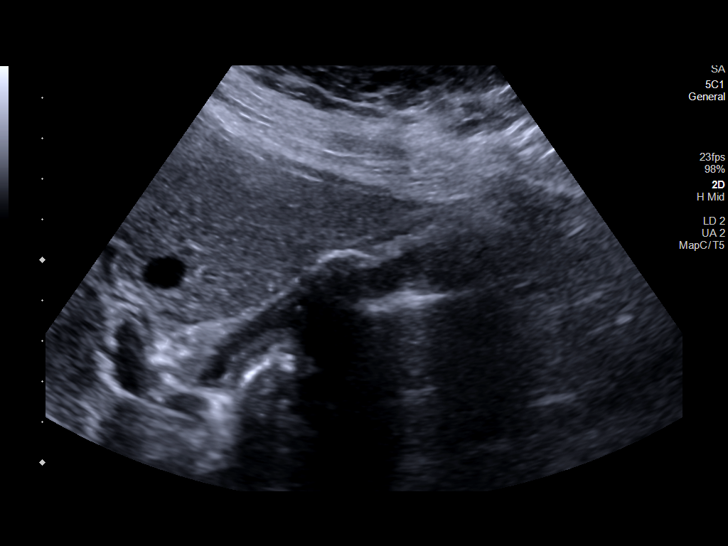
[im 84/84]
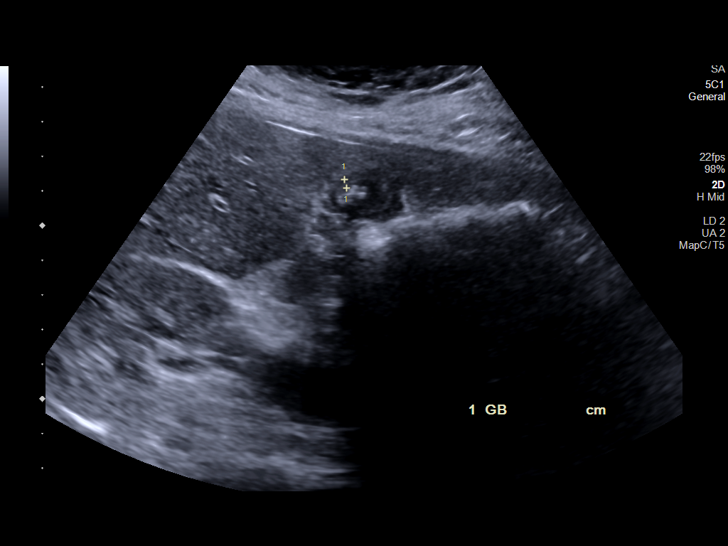

[14 of 25 positions shown; findings below may reference images not displayed]

FINDINGS: Gallbladder:

Shadowing stones. Normal wall thickness. Negative sonographic
Murphy.

Common bile duct:

Diameter: 6.6 mm

Liver:

No focal lesion identified. Within normal limits in parenchymal
echogenicity. Portal vein is patent on color Doppler imaging with
normal direction of blood flow towards the liver.

Other: Right kidney cortex may be slightly echogenic.
IMPRESSION: 1. Cholelithiasis without sonographic evidence for acute
cholecystitis. Common bile duct upper normal, correlate with LFTs
2. Question slightly echogenic right kidney as may be seen with
medical renal disease. Correlate with appropriate laboratory values.

## 2024-04-16 ENCOUNTER — Ambulatory Visit (HOSPITAL_BASED_OUTPATIENT_CLINIC_OR_DEPARTMENT_OTHER)
